# Patient Record
Sex: Male | Born: 1945
Health system: Southern US, Community
[De-identification: ages and names within clinical notes are randomized; demographics above are authoritative.]

## PROBLEM LIST (undated history)

## (undated) DIAGNOSIS — G4730 Sleep apnea, unspecified: Secondary | ICD-10-CM

## (undated) DIAGNOSIS — J449 Chronic obstructive pulmonary disease, unspecified: Secondary | ICD-10-CM

## (undated) DIAGNOSIS — E669 Obesity, unspecified: Secondary | ICD-10-CM

## (undated) DIAGNOSIS — H269 Unspecified cataract: Secondary | ICD-10-CM

## (undated) DIAGNOSIS — R972 Elevated prostate specific antigen [PSA]: Secondary | ICD-10-CM

## (undated) DIAGNOSIS — N529 Male erectile dysfunction, unspecified: Secondary | ICD-10-CM

## (undated) DIAGNOSIS — C801 Malignant (primary) neoplasm, unspecified: Secondary | ICD-10-CM

## (undated) DIAGNOSIS — D573 Sickle-cell trait: Secondary | ICD-10-CM

## (undated) DIAGNOSIS — Z961 Presence of intraocular lens: Secondary | ICD-10-CM

## (undated) DIAGNOSIS — Z972 Presence of dental prosthetic device (complete) (partial): Secondary | ICD-10-CM

## (undated) DIAGNOSIS — D649 Anemia, unspecified: Secondary | ICD-10-CM

## (undated) DIAGNOSIS — J189 Pneumonia, unspecified organism: Secondary | ICD-10-CM

## (undated) DIAGNOSIS — E538 Deficiency of other specified B group vitamins: Secondary | ICD-10-CM

## (undated) DIAGNOSIS — D709 Neutropenia, unspecified: Secondary | ICD-10-CM

## (undated) DIAGNOSIS — M1990 Unspecified osteoarthritis, unspecified site: Secondary | ICD-10-CM

## (undated) DIAGNOSIS — C61 Malignant neoplasm of prostate: Secondary | ICD-10-CM

## (undated) DIAGNOSIS — H401190 Primary open-angle glaucoma, unspecified eye, stage unspecified: Secondary | ICD-10-CM

## (undated) DIAGNOSIS — I10 Essential (primary) hypertension: Secondary | ICD-10-CM

## (undated) HISTORY — PX: COLONOSCOPY: SHX174

## (undated) HISTORY — PX: GLAUCOMA SURGERY: SHX656

## (undated) HISTORY — PX: EYE SURGERY: SHX253

## (undated) HISTORY — PX: OTHER SURGICAL HISTORY: SHX169

## (undated) HISTORY — PX: HERNIA REPAIR: SHX51

## (undated) HISTORY — PX: TRABECULECTOMY: SHX107

---

## 2005-08-11 ENCOUNTER — Ambulatory Visit: Payer: Self-pay | Admitting: Gastroenterology

## 2006-09-16 ENCOUNTER — Ambulatory Visit: Payer: Self-pay | Admitting: Ophthalmology

## 2006-09-16 ENCOUNTER — Other Ambulatory Visit: Payer: Self-pay

## 2006-09-23 ENCOUNTER — Ambulatory Visit: Payer: Self-pay | Admitting: Ophthalmology

## 2010-01-18 ENCOUNTER — Ambulatory Visit: Payer: Self-pay | Admitting: Family Medicine

## 2011-03-14 ENCOUNTER — Ambulatory Visit: Payer: Self-pay | Admitting: Internal Medicine

## 2011-04-03 ENCOUNTER — Ambulatory Visit: Payer: Self-pay | Admitting: Internal Medicine

## 2011-04-24 ENCOUNTER — Ambulatory Visit: Payer: Self-pay | Admitting: Internal Medicine

## 2012-04-13 DIAGNOSIS — Z961 Presence of intraocular lens: Secondary | ICD-10-CM | POA: Insufficient documentation

## 2012-04-15 ENCOUNTER — Ambulatory Visit: Payer: Self-pay | Admitting: Specialist

## 2013-06-23 DIAGNOSIS — C61 Malignant neoplasm of prostate: Secondary | ICD-10-CM | POA: Insufficient documentation

## 2014-01-17 ENCOUNTER — Ambulatory Visit: Payer: Self-pay | Admitting: Nurse Practitioner

## 2014-04-01 ENCOUNTER — Ambulatory Visit: Payer: Self-pay | Admitting: Family Medicine

## 2014-04-01 ENCOUNTER — Observation Stay: Payer: Self-pay | Admitting: Surgery

## 2014-04-01 LAB — CBC WITH DIFFERENTIAL/PLATELET
BASOS ABS: 0 10*3/uL (ref 0.0–0.1)
Basophil %: 0.1 %
EOS ABS: 0 10*3/uL (ref 0.0–0.7)
Eosinophil %: 0.1 %
HCT: 47.3 % (ref 40.0–52.0)
HGB: 15.3 g/dL (ref 13.0–18.0)
LYMPHS ABS: 0.5 10*3/uL — AB (ref 1.0–3.6)
Lymphocyte %: 5.7 %
MCH: 29.5 pg (ref 26.0–34.0)
MCHC: 32.4 g/dL (ref 32.0–36.0)
MCV: 91 fL (ref 80–100)
MONO ABS: 0.5 x10 3/mm (ref 0.2–1.0)
MONOS PCT: 5.4 %
NEUTROS PCT: 88.7 %
Neutrophil #: 7.6 10*3/uL — ABNORMAL HIGH (ref 1.4–6.5)
PLATELETS: 229 10*3/uL (ref 150–440)
RBC: 5.2 10*6/uL (ref 4.40–5.90)
RDW: 13.2 % (ref 11.5–14.5)
WBC: 8.6 10*3/uL (ref 3.8–10.6)

## 2014-04-01 LAB — BASIC METABOLIC PANEL
Anion Gap: 10 (ref 7–16)
BUN: 26 mg/dL — AB (ref 7–18)
CALCIUM: 9.3 mg/dL (ref 8.5–10.1)
Chloride: 108 mmol/L — ABNORMAL HIGH (ref 98–107)
Co2: 25 mmol/L (ref 21–32)
Creatinine: 1.36 mg/dL — ABNORMAL HIGH (ref 0.60–1.30)
EGFR (African American): >60
GFR CALC NON AF AMER: 56 — AB
GLUCOSE: 122 mg/dL — AB (ref 65–99)
OSMOLALITY: 291 (ref 275–301)
Potassium: 3.5 mmol/L (ref 3.5–5.1)
Sodium: 143 mmol/L (ref 136–145)

## 2014-04-01 LAB — PROTIME-INR
INR: 1
Prothrombin Time: 13.2 secs (ref 11.5–14.7)

## 2014-04-01 LAB — AMYLASE: Amylase: 72 U/L (ref 25–115)

## 2014-04-01 LAB — APTT: Activated PTT: 23.7 secs (ref 23.6–35.9)

## 2014-04-01 LAB — MAGNESIUM: MAGNESIUM: 2.2 mg/dL

## 2014-04-02 LAB — BASIC METABOLIC PANEL
Anion Gap: 7 (ref 7–16)
BUN: 25 mg/dL — AB (ref 7–18)
CREATININE: 1.2 mg/dL (ref 0.60–1.30)
Calcium, Total: 8.4 mg/dL — ABNORMAL LOW (ref 8.5–10.1)
Chloride: 111 mmol/L — ABNORMAL HIGH (ref 98–107)
Co2: 25 mmol/L (ref 21–32)
GLUCOSE: 113 mg/dL — AB (ref 65–99)
Osmolality: 290 (ref 275–301)
Potassium: 3.4 mmol/L — ABNORMAL LOW (ref 3.5–5.1)
Sodium: 143 mmol/L (ref 136–145)

## 2014-04-02 LAB — CBC WITH DIFFERENTIAL/PLATELET
BASOS ABS: 0 10*3/uL (ref 0.0–0.1)
Basophil %: 0.3 %
EOS ABS: 0.1 10*3/uL (ref 0.0–0.7)
Eosinophil %: 1.5 %
HCT: 39.5 % — ABNORMAL LOW (ref 40.0–52.0)
HGB: 12.9 g/dL — ABNORMAL LOW (ref 13.0–18.0)
LYMPHS PCT: 23.2 %
Lymphocyte #: 1.4 10*3/uL (ref 1.0–3.6)
MCH: 29.5 pg (ref 26.0–34.0)
MCHC: 32.6 g/dL (ref 32.0–36.0)
MCV: 91 fL (ref 80–100)
MONO ABS: 0.9 x10 3/mm (ref 0.2–1.0)
Monocyte %: 16.1 %
Neutrophil #: 3.4 10*3/uL (ref 1.4–6.5)
Neutrophil %: 58.9 %
Platelet: 204 10*3/uL (ref 150–440)
RBC: 4.36 10*6/uL — AB (ref 4.40–5.90)
RDW: 12.8 % (ref 11.5–14.5)
WBC: 5.8 10*3/uL (ref 3.8–10.6)

## 2014-04-10 ENCOUNTER — Ambulatory Visit: Payer: Self-pay | Admitting: Surgery

## 2014-04-13 ENCOUNTER — Ambulatory Visit: Payer: Self-pay | Admitting: Surgery

## 2014-10-14 NOTE — Op Note (Signed)
PATIENT NAME:  Kirk Wright, Kirk Wright MR#:  [id number] DATE OF BIRTH:  [birth year]  DATE OF PROCEDURE:  04/13/2014  PREOPERATIVE DIAGNOSIS: Right inguinal hernia.   POSTOPERATIVE DIAGNOSIS: Large right inguinal indirect hernia.   PROCEDURE PERFORMED: Robot-assisted laparoscopic right inguinal hernia repair with mesh.   ANESTHESIA: General endotracheal.   ESTIMATED BLOOD LOSS: 15 mL.   COMPLICATIONS: None.   SPECIMENS: None.  INDICATION FOR SURGERY: [name] is a pleasant 69 year old male with a long-term right inguinal hernia who recently was admitted with an episode of incarceration. He presented for semielective inguinal hernia repair.   DETAILS OF PROCEDURE: Informed consent was obtained. [name] was brought to the operating room suite. He was induced. Endotracheal tube was placed, general anesthesia was administered. His abdomen was prepped and draped in standard surgical fashion. A timeout was then performed, correctly identifying the patient name, operative site and procedure to be performed. A supraumbilical incision was made. This was deepened down to the fascia. The fascia was incised. The peritoneum was entered. Two stay sutures were placed in the fasciotomy. A Hassan trocar with a balloon was placed into the supraumbilical incision and the abdomen was insufflated. Two 8 mm robotic trocars were placed at the mid clavicular lines lateral to the umbilicus, and a 5 mm 10 mm trocar was placed in between the lateralmost and midline umbilical trocar. The robot was then docked. A bipolar grasper and a hot scissors were placed through the left and right arms, respectively. The hernia on the right was examined. It appeared to have a large component of an indirect hernia. I then proceeded to peel the peritoneum off the abdominal wall and created a peritoneal flap. I then reduced the sac with some difficulty from the hernia. I isolated Cooper ligament after the sac was reduced and the cord was  protected and identified and cord structures were protected. After that, I placed a Bard 3DMax mesh and attached it to [place] ligament with tacks and to the anterior abdominal wall with tacks. After happy with the repair, I then sewed the peritoneum shut with a quilled suture. I then removed all trocars under direct visualization. I used a figure-of-eight 0 Vicryl to close the supraumbilical fascia as well as a figure-of-eight 0 Vicryl to close the 10 mm trocar sites. I then closed all skin incisions with deep dermal 4-0 Monocryl sutures. The patient was then awoken, extubated and brought to the postanesthesia care unit. There were no immediate complications. Needle, sponge, and instrument counts were correct at the end of the procedure.    ____________________________ [name]. [name], MD cal:ST D: 04/15/2014 12:12:47 ET T: 04/15/2014 23:30:35 ET JOB#: [id number]  cc: Harrell Gave A. Lilou Kneip, MD, <Dictator> Floyde Parkins MD ELECTRONICALLY SIGNED 04/25/2014 7:02

## 2014-10-14 NOTE — H&P (Signed)
   History of Present Illness 85 yom who I saw recently with a RIH, who decided against surgery. Yesterday about 100 PM (30 hours ago) his hernia "came out and wouldn't go back in." This was associated with nausea, vomiting, obstipation, but no fever and no abdominal bloating or distention. He says he didn't sleep well.   Past Med/Surgical Hx:  anemia:   htn:   sickle  cell trait:   insomnia:   degenerative joint disease:   glaucoma:   hypercholesterolemia:   erectile dysfunction:   ALLERGIES:  Other- Explain in Comments Line: Hives  IVP Dye: Hives  HOME MEDICATIONS: Medication Instructions Status  hydrochlorothiazide-lisinopril 25 mg-20 mg oral tablet 1 tab(s) orally once a day Active  aspirin 81 mg oral delayed release tablet 1 tab(s) orally once a day Active   Family and Social History:  Family History Non-Contributory   Social History negative tobacco, negative ETOH, separated 1 year, no smoking or ETOH, lots of second hand smoke prior to separation, lives with his brother, works in returns and lifts a Education officer, [employer]   Review of Systems:  Fever/Chills No   Cough No   Sputum No   Abdominal Pain Yes   Diarrhea No   Constipation No   Nausea/Vomiting Yes   SOB/DOE No   Chest Pain No   Dysuria No   Tolerating PT Yes   Tolerating Diet No  Nauseated  Vomiting   Medications/Allergies Reviewed Medications/Allergies reviewed   Physical Exam:  GEN well developed, well nourished   HEENT pink conjunctivae, PERRL, hearing intact to voice, moist oral mucosa   NECK supple  No masses  trachea midline   RESP normal resp effort  clear BS  no use of accessory muscles   CARD regular rate  no murmur  no JVD   ABD denies tenderness  abdomen soft, flat, nontender, nondistended   GU large but soft (?direct) RIH, incarcerated, but manually reduced   EXTR negative cyanosis/clubbing, negative edema   SKIN normal to palpation, skin turgor good   NEURO  cranial nerves intact, follows commands, motor/sensory function intact   PSYCH lethargic, appropriate, but very drowsy, particularly when compared to my previous visit with him    Assessment/Admission Diagnosis Incarcerated RIH, now reduced, but patient is very drowsy   Plan Admit, check labs today and in AM Laparotomy if he deteriorates, semi-elective RIH repair on Monday if he improves   Electronic Signatures: Consuela Mimes (MD)  (Signed 10-Oct-15 18:19)  Authored: CHIEF COMPLAINT and HISTORY, PAST MEDICAL/SURGIAL HISTORY, ALLERGIES, HOME MEDICATIONS, FAMILY AND SOCIAL HISTORY, REVIEW OF SYSTEMS, PHYSICAL EXAM, ASSESSMENT AND PLAN   Last Updated: 10-Oct-15 18:19 by Consuela Mimes (MD)

## 2015-02-22 ENCOUNTER — Encounter: Payer: Self-pay | Admitting: *Deleted

## 2015-02-23 ENCOUNTER — Ambulatory Visit: Payer: BLUE CROSS/BLUE SHIELD | Admitting: Anesthesiology

## 2015-02-23 ENCOUNTER — Encounter
Admission: RE | Disposition: A | Discharge status: Home / Self Care | Payer: Self-pay | Source: Ambulatory Visit | Attending: Gastroenterology

## 2015-02-23 ENCOUNTER — Ambulatory Visit
Admission: RE | Admit: 2015-02-23 | Discharge: 2015-02-23 | Disposition: A | Discharge status: Home / Self Care | Payer: BLUE CROSS/BLUE SHIELD | Source: Ambulatory Visit | Attending: Gastroenterology | Admitting: Gastroenterology

## 2015-02-23 DIAGNOSIS — Z1211 Encounter for screening for malignant neoplasm of colon: Secondary | ICD-10-CM | POA: Diagnosis not present

## 2015-02-23 DIAGNOSIS — N529 Male erectile dysfunction, unspecified: Secondary | ICD-10-CM | POA: Insufficient documentation

## 2015-02-23 DIAGNOSIS — J449 Chronic obstructive pulmonary disease, unspecified: Secondary | ICD-10-CM | POA: Diagnosis not present

## 2015-02-23 DIAGNOSIS — Z6825 Body mass index (BMI) 25.0-25.9, adult: Secondary | ICD-10-CM | POA: Insufficient documentation

## 2015-02-23 DIAGNOSIS — Z79899 Other long term (current) drug therapy: Secondary | ICD-10-CM | POA: Diagnosis not present

## 2015-02-23 DIAGNOSIS — I10 Essential (primary) hypertension: Secondary | ICD-10-CM | POA: Diagnosis not present

## 2015-02-23 DIAGNOSIS — K644 Residual hemorrhoidal skin tags: Secondary | ICD-10-CM | POA: Diagnosis not present

## 2015-02-23 DIAGNOSIS — Z91041 Radiographic dye allergy status: Secondary | ICD-10-CM | POA: Diagnosis not present

## 2015-02-23 DIAGNOSIS — D123 Benign neoplasm of transverse colon: Secondary | ICD-10-CM | POA: Insufficient documentation

## 2015-02-23 DIAGNOSIS — R972 Elevated prostate specific antigen [PSA]: Secondary | ICD-10-CM | POA: Insufficient documentation

## 2015-02-23 DIAGNOSIS — K635 Polyp of colon: Secondary | ICD-10-CM | POA: Diagnosis not present

## 2015-02-23 DIAGNOSIS — Z86010 Personal history of colonic polyps: Secondary | ICD-10-CM | POA: Insufficient documentation

## 2015-02-23 DIAGNOSIS — E669 Obesity, unspecified: Secondary | ICD-10-CM | POA: Diagnosis not present

## 2015-02-23 DIAGNOSIS — D573 Sickle-cell trait: Secondary | ICD-10-CM | POA: Insufficient documentation

## 2015-02-23 DIAGNOSIS — D128 Benign neoplasm of rectum: Secondary | ICD-10-CM | POA: Insufficient documentation

## 2015-02-23 DIAGNOSIS — K5730 Diverticulosis of large intestine without perforation or abscess without bleeding: Secondary | ICD-10-CM | POA: Insufficient documentation

## 2015-02-23 DIAGNOSIS — H409 Unspecified glaucoma: Secondary | ICD-10-CM | POA: Insufficient documentation

## 2015-02-23 DIAGNOSIS — G4730 Sleep apnea, unspecified: Secondary | ICD-10-CM | POA: Insufficient documentation

## 2015-02-23 DIAGNOSIS — Z87891 Personal history of nicotine dependence: Secondary | ICD-10-CM | POA: Insufficient documentation

## 2015-02-23 DIAGNOSIS — Z7982 Long term (current) use of aspirin: Secondary | ICD-10-CM | POA: Insufficient documentation

## 2015-02-23 HISTORY — DX: Neutropenia, unspecified: D70.9

## 2015-02-23 HISTORY — DX: Presence of intraocular lens: Z96.1

## 2015-02-23 HISTORY — DX: Pneumonia, unspecified organism: J18.9

## 2015-02-23 HISTORY — DX: Sickle-cell trait: D57.3

## 2015-02-23 HISTORY — DX: Male erectile dysfunction, unspecified: N52.9

## 2015-02-23 HISTORY — DX: Essential (primary) hypertension: I10

## 2015-02-23 HISTORY — DX: Unspecified osteoarthritis, unspecified site: M19.90

## 2015-02-23 HISTORY — DX: Primary open-angle glaucoma, unspecified eye, stage unspecified: H40.1190

## 2015-02-23 HISTORY — PX: COLONOSCOPY WITH PROPOFOL: SHX5780

## 2015-02-23 HISTORY — DX: Obesity, unspecified: E66.9

## 2015-02-23 HISTORY — DX: Deficiency of other specified B group vitamins: E53.8

## 2015-02-23 HISTORY — DX: Elevated prostate specific antigen (PSA): R97.20

## 2015-02-23 HISTORY — DX: Chronic obstructive pulmonary disease, unspecified: J44.9

## 2015-02-23 HISTORY — DX: Unspecified cataract: H26.9

## 2015-02-23 HISTORY — DX: Sleep apnea, unspecified: G47.30

## 2015-02-23 SURGERY — COLONOSCOPY WITH PROPOFOL
Anesthesia: General

## 2015-02-23 MED ORDER — EPHEDRINE SULFATE 50 MG/ML IJ SOLN
INTRAMUSCULAR | Status: DC | PRN
  Administered 2015-02-23: 10 mg via INTRAVENOUS
  Administered 2015-02-23: 5 mg via INTRAVENOUS

## 2015-02-23 MED ORDER — PROPOFOL INFUSION 10 MG/ML OPTIME
INTRAVENOUS | Status: DC | PRN
Start: 2015-02-23 — End: 2015-02-23
  Administered 2015-02-23: 120 ug/kg/min via INTRAVENOUS

## 2015-02-23 MED ORDER — SODIUM CHLORIDE 0.9 % IV SOLN: INTRAVENOUS | Status: DC

## 2015-02-23 MED ORDER — GLYCOPYRROLATE 0.2 MG/ML IJ SOLN
INTRAMUSCULAR | Status: DC | PRN
  Administered 2015-02-23: 0.2 mg via INTRAVENOUS

## 2015-02-23 MED ORDER — PROPOFOL 10 MG/ML IV BOLUS
INTRAVENOUS | Status: DC | PRN
  Administered 2015-02-23: 50 mg via INTRAVENOUS
  Administered 2015-02-23: 20 mg via INTRAVENOUS

## 2015-02-23 MED ORDER — LIDOCAINE HCL (CARDIAC) 20 MG/ML IV SOLN
INTRAVENOUS | Status: DC | PRN
  Administered 2015-02-23: 50 mg via INTRAVENOUS

## 2015-02-23 MED ORDER — SODIUM CHLORIDE 0.9 % IV SOLN
INTRAVENOUS | Status: DC
  Administered 2015-02-23: 1000 mL via INTRAVENOUS

## 2015-02-23 MED ORDER — MIDAZOLAM HCL 5 MG/5ML IJ SOLN
INTRAMUSCULAR | Status: DC | PRN
  Administered 2015-02-23: 1 mg via INTRAVENOUS

## 2015-02-23 NOTE — H&P (Signed)
Outpatient short stay form Pre-procedure 02/23/2015 8:19 AM [name] MD  Primary Physician: Dr Sherrin Daisy  Reason for visit:  Colonoscopy  History of present illness:  Patient is a 69 year old male with a personal history of tubulovillous polyp on a colonoscopy in 2007. He is presenting for repeat colonoscopy today. He tolerated his prep well. He does take an 81 mg aspirin but has not for several days. He has not had any symptoms such as rectal bleeding or abdominal pain.    Current facility-administered medications:  .  0.9 %  sodium chloride infusion, , Intravenous, Continuous, [name], MD, Last Rate: 20 mL/hr at 02/23/15 0814, 1,000 mL at 02/23/15 0814 .  0.9 %  sodium chloride infusion, , Intravenous, Continuous, [name], MD  Prescriptions prior to admission  Medication Sig Dispense Refill Last Dose  . acetaZOLAMIDE (DIAMOX) 250 MG tablet Take 250 mg by mouth 3 (three) times daily.     Marland Kitchen aspirin 81 MG tablet Take 81 mg by mouth daily.     . bimatoprost (LUMIGAN) 0.03 % ophthalmic solution 1 drop at bedtime.     . brimonidine-timolol (COMBIGAN) 0.2-0.5 % ophthalmic solution Place 1 drop into both eyes every 12 (twelve) hours.     . brinzolamide (AZOPT) 1 % ophthalmic suspension 1 drop 3 (three) times daily.     . ferrous sulfate 325 (65 FE) MG tablet Take 325 mg by mouth daily with breakfast.     . ibuprofen (ADVIL,MOTRIN) 200 MG tablet Take 200 mg by mouth every 6 (six) hours as needed.     Marland Kitchen lisinopril-hydrochlorothiazide (PRINZIDE,ZESTORETIC) 10-12.5 MG per tablet Take 1 tablet by mouth daily.     . tadalafil (CIALIS) 5 MG tablet Take 5 mg by mouth daily as needed for erectile dysfunction.        Allergies  Allergen Reactions  . Contrast Media [Iodinated Diagnostic Agents] Rash     Past Medical History  Diagnosis Date  . COPD (chronic obstructive pulmonary disease)   . Vitamin B 12 deficiency   . Cataract   . Neutropenia   . Elevated PSA    . Sleep apnea   . Hypertension   . POAG (primary open-angle glaucoma)   . Pneumonia   . Pseudophakia of both eyes   . Erectile dysfunction   . DJD (degenerative joint disease)   . Obesity   . Sickle cell trait   . Obesity     Review of systems:      Physical Exam    Heart and lungs: Regular rate and rhythm without rub or gallop, lungs are bilaterally clear    HEENT: Normocephalic atraumatic eyes are anicteric    Other:     Pertinant exam for procedure: Soft nontender nondistended bowel sounds positive normoactive    Planned proceedures: Colonoscopy and indicated procedures I have discussed the risks benefits and complications of procedures to include not limited to bleeding, infection, perforation and the risk of sedation and the patient wishes to proceed.    [name], MD Gastroenterology 02/23/2015  8:19 AM

## 2015-02-23 NOTE — Op Note (Signed)
[facility] Gastroenterology Patient Name: Kirk Wright Procedure Date: 02/23/2015 8:22 AM MRN: [id number] Account #: [id number] Date of Birth: [birth year] Admit Type: Outpatient Age: 69 Room: [facility] ENDO ROOM 3 Gender: Male Note Status: Finalized Procedure:         Colonoscopy Indications:       Personal history of colonic polyps Providers:         [name], MD Referring MD:      Shirline Frees (Referring MD) Medicines:         Monitored Anesthesia Care Complications:     No immediate complications. Procedure:         Pre-Anesthesia Assessment:                    - ASA Grade Assessment: III - A patient with severe                     systemic disease.                    After obtaining informed consent, the colonoscope was                     passed under direct vision. Throughout the procedure, the                     patient's blood pressure, pulse, and oxygen saturations                     were monitored continuously. The Colonoscope was                     introduced through the anus and advanced to the the cecum,                     identified by appendiceal orifice and ileocecal valve. The                     colonoscopy was performed without difficulty. The patient                     tolerated the procedure well. The quality of the bowel                     preparation was good. Findings:      Many small and large-mouthed diverticula were found in the proximal       transverse colon and in the ascending colon.      Multiple small-mouthed diverticula were found in the sigmoid colon and       in the descending colon.      A 2 mm polyp was found at the splenic flexure. The polyp was sessile.       The polyp was removed with a cold biopsy forceps. Resection and       retrieval were complete.      A 2 mm polyp was found in the descending colon. The polyp was sessile.       The polyp was removed with a cold biopsy forceps. Resection and   retrieval were complete.      Four sessile polyps were found in the rectum. The polyps were less than       1 mm in size. These polyps were removed with a cold biopsy forceps.       Resection and retrieval were complete.  The retroflexed view of the distal rectum and anal verge was normal and       showed no anal or rectal abnormalities.      Non-bleeding external hemorrhoids were found. The hemorrhoids were small. Impression:        - Diverticulosis in the proximal transverse colon and in                     the ascending colon.                    - Diverticulosis in the sigmoid colon and in the                     descending colon.                    - One 2 mm polyp at the splenic flexure. Resected and                     retrieved.                    - One 2 mm polyp in the descending colon. Resected and                     retrieved.                    - Four less than 1 mm polyps in the rectum. Resected and                     retrieved.                    - The distal rectum and anal verge are normal on                     retroflexion view.                    - Non-bleeding external hemorrhoids. Recommendation:    - Repeat colonoscopy in 5 years for adenoma surveillance. Procedure Code(s): --- Professional ---                    [phone], Colonoscopy, flexible; with biopsy, single or                     multiple Diagnosis Code(s): --- Professional ---                    211.3, Benign neoplasm of colon                    569.0, Anal and rectal polyp                    455.3, External hemorrhoids without mention of complication                    V12.72, Personal history of colonic polyps                    562.10, Diverticulosis of colon (without mention of                     hemorrhage) CPT copyright 2014 American Medical Association. All rights reserved. The codes documented in this report are preliminary and upon coder review may  be revised to meet  current compliance  requirements. [name], MD 02/23/2015 8:46:33 AM This report has been signed electronically. Number of Addenda: 0 Note Initiated On: 02/23/2015 8:22 AM Scope Withdrawal Time: 0 hours 8 minutes 40 seconds  Total Procedure Duration: 0 hours 13 minutes 10 seconds       [facility]

## 2015-02-23 NOTE — Anesthesia Preprocedure Evaluation (Signed)
Anesthesia Evaluation  Patient identified by MRN, date of birth, ID band Patient awake    Reviewed: Allergy & Precautions, NPO status , Patient's Chart, lab work & pertinent test results  Airway Mallampati: II       Dental  (+) Partial Lower, Poor Dentition, Partial Upper, Chipped   Pulmonary sleep apnea , pneumonia -, resolved, COPD COPD inhaler, former smoker,  breath sounds clear to auscultation  Pulmonary exam normal       Cardiovascular hypertension, Normal cardiovascular exam    Neuro/Psych negative neurological ROS  negative psych ROS   GI/Hepatic negative GI ROS, Neg liver ROS,   Endo/Other  negative endocrine ROS  Renal/GU   negative genitourinary   Musculoskeletal  (+) Arthritis -, Osteoarthritis,    Abdominal Normal abdominal exam  (+)   Peds negative pediatric ROS (+)  Hematology negative hematology ROS (+)   Anesthesia Other Findings glaucoma  Reproductive/Obstetrics                             Anesthesia Physical Anesthesia Plan  ASA: III  Anesthesia Plan: General   Post-op Pain Management:    Induction: Intravenous  Airway Management Planned: Nasal Cannula  Additional Equipment:   Intra-op Plan:   Post-operative Plan:   Informed Consent: I have reviewed the patients History and Physical, chart, labs and discussed the procedure including the risks, benefits and alternatives for the proposed anesthesia with the patient or authorized representative who has indicated his/her understanding and acceptance.   Dental advisory given  Plan Discussed with: CRNA and Surgeon  Anesthesia Plan Comments:         Anesthesia Quick Evaluation

## 2015-02-23 NOTE — Transfer of Care (Signed)
Immediate Anesthesia Transfer of Care Note  Patient: Kirk Wright  Procedure(s) Performed: Procedure(s): COLONOSCOPY WITH PROPOFOL (N/A)  Patient Location: PACU  Anesthesia Type:General  Level of Consciousness: awake  Airway & Oxygen Therapy: Patient Spontanous Breathing and Patient connected to nasal cannula oxygen  Post-op Assessment: Report given to RN  Post vital signs: Reviewed  Last Vitals:  Filed Vitals:   02/23/15 0850  BP: 89/57  Pulse: 60  Temp: 36.2 C  Resp: 10    Complications: No apparent anesthesia complications

## 2015-02-27 LAB — SURGICAL PATHOLOGY

## 2015-02-27 NOTE — Anesthesia Postprocedure Evaluation (Signed)
  Anesthesia Post-op Note  Patient: Kirk Wright  Procedure(s) Performed: Procedure(s): COLONOSCOPY WITH PROPOFOL (N/A)  Anesthesia type:General  Patient location: PACU  Post pain: Pain level controlled  Post assessment: Post-op Vital signs reviewed, Patient's Cardiovascular Status Stable, Respiratory Function Stable, Patent Airway and No signs of Nausea or vomiting  Post vital signs: Reviewed and stable  Last Vitals:  Filed Vitals:   02/23/15 0920  BP: 109/63  Pulse: 45  Temp:   Resp: 13    Level of consciousness: awake, alert  and patient cooperative  Complications: No apparent anesthesia complications

## 2016-09-23 NOTE — Progress Notes (Signed)
09/26/2016 1:49 PM   Kirk Wright 10/25/1945 [id number]  Referring provider: Sherrin Daisy, MD [place], [place] [address]  Chief Complaint  Patient presents with  . New Patient (Initial Visit)    HPI: Patient is a 71 year old African-American male with prostate cancer an AS, elevated PSA and erectile dysfunction who is referred by Dr. Kandice Robinsons.  Prostate cancer Patient underwent a prostate biopsy on 11/14/2012 with Dr. [name] for a PSA of 6.86.  58 gram prostate.  Results were Gleason 3 + 3 cancer in 2/12 cores.  2% core of lateral mid and 8% of lateral apex.  He was then referred to Dr. [name] on 01/25/2013 for possible robotic prostatectomy.  Patient chose to manage his prostate cancer with active surveillance.  He was to RTC in 6 months, but he was lost to follow up.  His last PSA was 6.18 on 10/13/2014.  PSA is drawn today.    BPH WITH LUTS His IPSS score today is 6, which is mild lower urinary tract symptomatology. He is mostly satisfied with his quality life due to his urinary symptoms.  His major complaints today are a weak stream and incomplete emptying.  He has had these symptoms for several years.  He denies any dysuria, hematuria or suprapubic pain.   He currently taking Cialis 5 mg daily.    He also denies any recent fevers, chills, nausea or vomiting.     IPSS    Row Name 09/26/16 1400         International Prostate Symptom Score   How often have you had the sensation of not emptying your bladder? Less than half the time     How often have you had to urinate less than every two hours? Less than 1 in 5 times     How often have you found you stopped and started again several times when you urinated? Not at All     How often have you found it difficult to postpone urination? Not at All     How often have you had a weak urinary stream? About half the time     How often have you had to strain to start urination? Not at All     How many times did  you typically get up at night to urinate? None     Total IPSS Score 6       Quality of Life due to urinary symptoms   If you were to spend the rest of your life with your urinary condition just the way it is now how would you feel about that? Mostly Satisfied        Score:  1-7 Mild 8-19 Moderate 20-35 Severe   Erectile dysfunction His SHIM score is 21, which is mild ED.   He has been having difficulty with erections for twenty years.  His major complaint is maintaining an erection.   His libido is preserved.   His risk factors for ED are age, BPH, prostate cancer, HTN and sleep apnea.  He denies any painful erections or curvatures with his erections.   He is still having spontaneous erections.  He has tried Cialis and Viagra in the past with minimal results.       SHIM    Row Name 09/26/16 1436         SHIM: Over the last 6 months:   How do you rate your confidence that you could get and keep an erection? Very  Low     When you had erections with sexual stimulation, how often were your erections hard enough for penetration (entering your partner)? Almost Always or Always     During sexual intercourse, how often were you able to maintain your erection after you had penetrated (entered) your partner? Almost Always or Always     During sexual intercourse, how difficult was it to maintain your erection to completion of intercourse? Not Difficult     When you attempted sexual intercourse, how often was it satisfactory for you? Almost Always or Always       SHIM Total Score   SHIM 21        Score: 1-7 Severe ED 8-11 Moderate ED 12-16 Mild-Moderate ED 17-21 Mild ED 22-25 No ED   PMH: Past Medical History:  Diagnosis Date  . Cataract   . COPD (chronic obstructive pulmonary disease) ([place])   . DJD (degenerative joint disease)   . Elevated PSA   . Erectile dysfunction   . Hypertension   . Neutropenia ([place])   . Obesity   . Obesity   . Pneumonia   . POAG (primary open-angle  glaucoma)   . Pseudophakia of both eyes   . Sickle cell trait ([place])   . Sleep apnea   . Vitamin B 12 deficiency     Surgical History: Past Surgical History:  Procedure Laterality Date  . COLONOSCOPY WITH PROPOFOL N/A 02/23/2015   Procedure: COLONOSCOPY WITH PROPOFOL;  Surgeon: [name], MD;  Location: [facility] ENDOSCOPY;  Service: Endoscopy;  Laterality: N/A;  . EYE SURGERY    . GLAUCOMA SURGERY    . HERNIA REPAIR    . PROSTRATE BX      Home Medications:  Allergies as of 09/26/2016      Reactions   Contrast Media [iodinated Diagnostic Agents] Rash      Medication List       Accurate as of 09/26/16 11:59 PM. Always use your most recent med list.          acetaZOLAMIDE 250 MG tablet Commonly known as:  DIAMOX Take 250 mg by mouth 3 (three) times daily.   aspirin 81 MG tablet Take 81 mg by mouth daily.   bimatoprost 0.03 % ophthalmic solution Commonly known as:  LUMIGAN 1 drop at bedtime.   brimonidine-timolol 0.2-0.5 % ophthalmic solution Commonly known as:  COMBIGAN Place 1 drop into both eyes every 12 (twelve) hours.   brinzolamide 1 % ophthalmic suspension Commonly known as:  AZOPT 1 drop 3 (three) times daily.   ferrous sulfate 325 (65 FE) MG tablet Take 325 mg by mouth daily with breakfast.   ibuprofen 200 MG tablet Commonly known as:  ADVIL,MOTRIN Take 200 mg by mouth every 6 (six) hours as needed.   lisinopril-hydrochlorothiazide 10-12.5 MG tablet Commonly known as:  PRINZIDE,ZESTORETIC Take 1 tablet by mouth daily.   tadalafil 5 MG tablet Commonly known as:  CIALIS Take 5 mg by mouth daily as needed for erectile dysfunction.       Allergies:  Allergies  Allergen Reactions  . Contrast Media [Iodinated Diagnostic Agents] Rash    Family History: Family History  Problem Relation Age of Onset  . Kidney cancer Neg Hx   . Kidney disease Neg Hx   . Prostate cancer Neg Hx     Social History:  reports that he has quit smoking. He has quit  using smokeless tobacco. He reports that he does not drink alcohol or use drugs.  ROS:  UROLOGY Frequent Urination?: No Hard to postpone urination?: No Burning/pain with urination?: No Get up at night to urinate?: No Leakage of urine?: No Urine stream starts and stops?: No Trouble starting stream?: No Do you have to strain to urinate?: No Blood in urine?: No Urinary tract infection?: No Sexually transmitted disease?: No Injury to kidneys or bladder?: No Painful intercourse?: No Weak stream?: Yes Erection problems?: Yes Penile pain?: No  Gastrointestinal Nausea?: No Vomiting?: No Indigestion/heartburn?: No Diarrhea?: No Constipation?: No  Constitutional Fever: No Night sweats?: No Weight loss?: No Fatigue?: No  Skin Skin rash/lesions?: No Itching?: Yes  Eyes Blurred vision?: No Double vision?: No  Ears/Nose/Throat Sore throat?: No Sinus problems?: No  Hematologic/Lymphatic Swollen glands?: No Easy bruising?: No  Cardiovascular Leg swelling?: No Chest pain?: No  Respiratory Cough?: No Shortness of breath?: No  Endocrine Excessive thirst?: No  Musculoskeletal Back pain?: No Joint pain?: No  Neurological Headaches?: No Dizziness?: No  Psychologic Depression?: No Anxiety?: No  Physical Exam: BP 126/67   Pulse 90   Ht 6' (1.829 m)   Wt 212 lb (96.2 kg)   BMI 28.75 kg/m   Constitutional: Well nourished. Alert and oriented, No acute distress. HEENT: [place] AT, moist mucus membranes. Trachea midline, no masses. Cardiovascular: No clubbing, cyanosis, or edema. Respiratory: Normal respiratory effort, no increased work of breathing. GI: Abdomen is soft, non tender, non distended, no abdominal masses. Liver and spleen not palpable.  No hernias appreciated.  Stool sample for occult testing is not indicated.   GU: No CVA tenderness.  No bladder fullness or masses.  Patient with uncircumcised phallus.  Foreskin easily retracted Urethral meatus is  patent.  No penile discharge. No penile lesions or rashes. Scrotum without lesions, cysts, rashes and/or edema.  Testicles are located scrotally bilaterally. No masses are appreciated in the testicles. Left and right epididymis are normal. Rectal: Patient with  normal sphincter tone. Anus and perineum without scarring or rashes. No rectal masses are appreciated. Prostate is approximately 55 grams, no nodules are appreciated. Seminal vesicles are normal. Skin: No rashes, bruises or suspicious lesions. Lymph: No cervical or inguinal adenopathy. Neurologic: Grossly intact, no focal deficits, moving all 4 extremities. Psychiatric: Normal mood and affect.  Laboratory Data: Lab Results  Component Value Date   WBC 5.8 04/02/2014   HGB 12.9 (L) 04/02/2014   HCT 39.5 (L) 04/02/2014   MCV 91 04/02/2014   PLT 204 04/02/2014    Lab Results  Component Value Date   CREATININE 1.20 04/02/2014    PSA History  6.18 ng/mL on 10/13/2014  Assessment & Plan:    1. Prostate cancer  - Gleason 3 + 3 in 2/12 cores with PSA of 6.86,  on active surveillance - has not had a repeated biopsy  - PSA is drawn today  - Patient will be schedule for a TRUSPBx of prostate.  The procedure is explained and the risks involved, such as blood in urine, blood in stool, blood in semen, infection, urinary retention, and on rare occasions sepsis and death.  Patient understands the risks as explained to him and he wishes to proceed.  Patient is on ASA and is advised to discontinue the medication ten days prior to the biopsy.   2. Elevated PSA  - PSA of 6.18 in 2016 - has not seen an urologist recently  - PSA drawn today   3. Erectile dysfunction  - SHIM score is 21  - I explained to the patient that in order to  achieve an erection it takes good functioning of the nervous system (parasympathetic, sympathetic, sensory and motor), good blood flow into the erectile tissue of the penis and a desire to have sex  - I explained  that conditions like diabetes, hypertension, coronary artery disease, peripheral vascular disease, smoking, alcohol consumption, age, sleep apnea and BPH can diminish the ability to have an erection  - We discussed trying a different PDE5 inhibitor, intra-urethral suppositories, intracavernous vasoactive drug injection therapy, vacuum constriction device and penile prosthesis implantation  - He would like to try Trimix at this time  - RTC for Trimix test dose  4. BPH with LUTS  - IPSS score is 6/2  - Continue conservative management, avoiding bladder irritants and timed voiding's  - most bothersome symptoms is/are weak stream  - Continue Cialis 5 mg daily   - Biopsy of prostate pending   Return for prostate biopsy; schedule Trimix injection in the am.  These notes generated with voice recognition software. I apologize for typographical errors.  Zara Council, [facility] [address], [place], [place] [address] [phone]

## 2016-09-24 ENCOUNTER — Other Ambulatory Visit: Payer: Self-pay | Admitting: *Deleted

## 2016-09-24 DIAGNOSIS — Z8546 Personal history of malignant neoplasm of prostate: Secondary | ICD-10-CM

## 2016-09-26 ENCOUNTER — Other Ambulatory Visit
Admission: RE | Admit: 2016-09-26 | Discharge: 2016-09-26 | Disposition: A | Discharge status: Home / Self Care | Payer: BLUE CROSS/BLUE SHIELD | Source: Ambulatory Visit | Attending: Urology | Admitting: Urology

## 2016-09-26 ENCOUNTER — Ambulatory Visit (INDEPENDENT_AMBULATORY_CARE_PROVIDER_SITE_OTHER): Payer: BLUE CROSS/BLUE SHIELD | Admitting: Urology

## 2016-09-26 ENCOUNTER — Encounter: Payer: Self-pay | Admitting: Urology

## 2016-09-26 ENCOUNTER — Telehealth: Payer: Self-pay | Admitting: Urology

## 2016-09-26 VITALS — BP 126/67 | HR 90 | Ht 72.0 in | Wt 212.0 lb

## 2016-09-26 DIAGNOSIS — C61 Malignant neoplasm of prostate: Secondary | ICD-10-CM

## 2016-09-26 DIAGNOSIS — N401 Enlarged prostate with lower urinary tract symptoms: Secondary | ICD-10-CM | POA: Diagnosis not present

## 2016-09-26 DIAGNOSIS — R972 Elevated prostate specific antigen [PSA]: Secondary | ICD-10-CM | POA: Diagnosis not present

## 2016-09-26 DIAGNOSIS — N138 Other obstructive and reflux uropathy: Secondary | ICD-10-CM | POA: Diagnosis not present

## 2016-09-26 DIAGNOSIS — N529 Male erectile dysfunction, unspecified: Secondary | ICD-10-CM | POA: Diagnosis not present

## 2016-09-26 DIAGNOSIS — Z8546 Personal history of malignant neoplasm of prostate: Secondary | ICD-10-CM | POA: Insufficient documentation

## 2016-09-26 LAB — PSA: PSA: 8.81 ng/mL — ABNORMAL HIGH (ref 0.00–4.00)

## 2016-09-26 NOTE — Telephone Encounter (Signed)
Would you call in a script for Trimix injection for this patient?

## 2016-09-29 ENCOUNTER — Telehealth: Payer: Self-pay

## 2016-09-29 DIAGNOSIS — R972 Elevated prostate specific antigen [PSA]: Secondary | ICD-10-CM

## 2016-09-29 NOTE — Telephone Encounter (Signed)
Spoke with Kirk Wright at [facility] who is going to handle getting labs added.

## 2016-09-29 NOTE — Telephone Encounter (Signed)
Medication called into [facility]. 

## 2016-09-29 NOTE — Telephone Encounter (Signed)
-----   Message from Nori Riis, PA-C sent at 09/29/2016  8:20 AM EDT ----- Please add a free and total PSA to his blood work.

## 2016-09-30 LAB — PSA, TOTAL AND FREE
PSA FREE: 2.28 ng/mL
PSA, Free Pct: 30 %
Prostate Specific Ag, Serum: 7.6 ng/mL — ABNORMAL HIGH (ref 0.0–4.0)

## 2016-10-03 ENCOUNTER — Telehealth: Payer: Self-pay

## 2016-10-03 NOTE — Telephone Encounter (Signed)
He has had a biopsy in the past, but he will need the instructions.  He does take a daily ASA.

## 2016-10-03 NOTE — Telephone Encounter (Signed)
Does he need instructions or just an appt?

## 2016-10-03 NOTE — Telephone Encounter (Signed)
-----   Message from Nori Riis, PA-C sent at 10/03/2016 11:57 AM EDT ----- Spoke with patient regarding his PSA and diagnosis of prostate cancer on AS.  He has not had a repeated biopsy in a few years.  He is agreeable to undergoing another biopsy at this time.

## 2016-10-06 NOTE — Telephone Encounter (Signed)
Spoke with pt in reference to physician who rx ASA daily. Pt stated that was Dr. Harle Stanford who has since retired. Pt then stated he seen [facility] in [place]. Made pt aware will need to get clearance to come off of ASA and then we can proceed forward. Pt voiced understanding of whole conversation.  Clearance was faxed to pt PCP office.

## 2016-10-11 NOTE — Progress Notes (Signed)
10/13/2016 10:12 AM   Kirk Wright 1945/09/23 [id number]  Referring provider: Sherrin Daisy, MD [place], [place] [address]  Chief Complaint  Patient presents with  . Erectile Dysfunction    Trimix injection    HPI: 31 AAM who presents today for a test dose of Trimix injection.  Erectile dysfunction His SHIM score is 21, which is mild ED.   He has been having difficulty with erections for twenty years.  His major complaint is maintaining an erection.   His libido is preserved.   His risk factors for ED are age, BPH, prostate cancer, HTN and sleep apnea.  He denies any painful erections or curvatures with his erections.   He is still having spontaneous erections.  He has tried Cialis and Viagra in the past with minimal results.                   SHIM    Row Name 09/26/16 1436             SHIM: Over the last 6 months:   How do you rate your confidence that you could get and keep an erection? Very Low     When you had erections with sexual stimulation, how often were your erections hard enough for penetration (entering your partner)? Almost Always or Always     During sexual intercourse, how often were you able to maintain your erection after you had penetrated (entered) your partner? Almost Always or Always     During sexual intercourse, how difficult was it to maintain your erection to completion of intercourse? Not Difficult     When you attempted sexual intercourse, how often was it satisfactory for you? Almost Always or Always           SHIM Total Score   SHIM 21        Score: 1-7 Severe ED 8-11 Moderate ED 12-16 Mild-Moderate ED 17-21 Mild ED 22-25 No ED  He presents today for try mix injection.  I explained the injection procedure and patient's questions are answered and he is ready to proceed.  Prostate cancer Patient underwent a prostate biopsy on 11/14/2012 with Dr. [name] for a PSA of 6.86.  58 gram  prostate.  Results were Gleason 3 + 3 cancer in 2/12 cores.  2% core of lateral mid and 8% of lateral apex.  He was then referred to Dr. [name] on 01/25/2013 for possible robotic prostatectomy.  Patient chose to manage his prostate cancer with active surveillance.  He was to RTC in 6 months, but he was lost to follow up.  PSA 8.81 ng/mL in 09/2016.     PMH: Past Medical History:  Diagnosis Date  . Cataract   . COPD (chronic obstructive pulmonary disease) ([place])   . DJD (degenerative joint disease)   . Elevated PSA   . Erectile dysfunction   . Hypertension   . Neutropenia ([place])   . Obesity   . Obesity   . Pneumonia   . POAG (primary open-angle glaucoma)   . Pseudophakia of both eyes   . Sickle cell trait ([place])   . Sleep apnea   . Vitamin B 12 deficiency     Surgical History: Past Surgical History:  Procedure Laterality Date  . COLONOSCOPY WITH PROPOFOL N/A 02/23/2015   Procedure: COLONOSCOPY WITH PROPOFOL;  Surgeon: [name], MD;  Location: [facility] ENDOSCOPY;  Service: Endoscopy;  Laterality: N/A;  . EYE SURGERY    .  GLAUCOMA SURGERY    . HERNIA REPAIR    . PROSTRATE BX      Home Medications:  Allergies as of 10/13/2016      Reactions   Contrast Media [iodinated Diagnostic Agents] Rash      Medication List       Accurate as of 10/13/16 10:12 AM. Always use your most recent med list.          acetaZOLAMIDE 250 MG tablet Commonly known as:  DIAMOX Take 250 mg by mouth 3 (three) times daily.   aspirin 81 MG tablet Take 81 mg by mouth daily.   bimatoprost 0.03 % ophthalmic solution Commonly known as:  LUMIGAN 1 drop at bedtime.   brimonidine-timolol 0.2-0.5 % ophthalmic solution Commonly known as:  COMBIGAN Place 1 drop into both eyes every 12 (twelve) hours.   brinzolamide 1 % ophthalmic suspension Commonly known as:  AZOPT 1 drop 3 (three) times daily.   ferrous sulfate 325 (65 FE) MG tablet Take 325 mg by mouth daily with breakfast.   ibuprofen  200 MG tablet Commonly known as:  ADVIL,MOTRIN Take 200 mg by mouth every 6 (six) hours as needed.   lisinopril-hydrochlorothiazide 10-12.5 MG tablet Commonly known as:  PRINZIDE,ZESTORETIC Take 1 tablet by mouth daily.   PROAIR HFA 108 (90 Base) MCG/ACT inhaler Generic drug:  albuterol TAKE 2 PUFFS INTO THE LUNGS EVERY 6 HOURS AS NEEDED FOR WHEEZING OR SHORTNESS OF BREATH   SYMBICORT 160-4.5 MCG/ACT inhaler Generic drug:  budesonide-formoterol Inhale into the lungs.   tadalafil 5 MG tablet Commonly known as:  CIALIS Take 5 mg by mouth daily as needed for erectile dysfunction.       Allergies:  Allergies  Allergen Reactions  . Contrast Media [Iodinated Diagnostic Agents] Rash    Family History: Family History  Problem Relation Age of Onset  . Kidney cancer Neg Hx   . Kidney disease Neg Hx   . Prostate cancer Neg Hx   . Bladder Cancer Neg Hx     Social History:  reports that he has quit smoking. He has quit using smokeless tobacco. He reports that he does not drink alcohol or use drugs.  ROS: UROLOGY Frequent Urination?: No Hard to postpone urination?: No Burning/pain with urination?: No Get up at night to urinate?: No Leakage of urine?: No Urine stream starts and stops?: No Trouble starting stream?: No Do you have to strain to urinate?: No Blood in urine?: No Urinary tract infection?: No Sexually transmitted disease?: No Injury to kidneys or bladder?: No Painful intercourse?: No Weak stream?: No Erection problems?: Yes Penile pain?: No  Gastrointestinal Nausea?: No Vomiting?: No Indigestion/heartburn?: No Diarrhea?: No Constipation?: No  Constitutional Fever: No Night sweats?: No Weight loss?: No Fatigue?: No  Skin Skin rash/lesions?: No Itching?: No  Eyes Blurred vision?: No Double vision?: No  Ears/Nose/Throat Sore throat?: No Sinus problems?: No  Hematologic/Lymphatic Swollen glands?: No Easy bruising?: No  Cardiovascular Leg  swelling?: No Chest pain?: No  Respiratory Cough?: No Shortness of breath?: No  Endocrine Excessive thirst?: No  Musculoskeletal Back pain?: No Joint pain?: No  Neurological Headaches?: No Dizziness?: No  Psychologic Depression?: No Anxiety?: No  Physical Exam: BP 120/69   Pulse (!) 52   Ht 6' (1.829 m)   Wt 211 lb 8 oz (95.9 kg)   BMI 28.68 kg/m   Constitutional: Well nourished. Alert and oriented, No acute distress. HEENT: [place] AT, moist mucus membranes. Trachea midline, no masses. Cardiovascular: No clubbing, cyanosis,  or edema. Respiratory: Normal respiratory effort, no increased work of breathing. GI: Abdomen is soft, non tender, non distended, no abdominal masses. Liver and spleen not palpable.  No hernias appreciated.  Stool sample for occult testing is not indicated.   GU: No CVA tenderness.  No bladder fullness or masses.  Patient with uncircumcised phallus. Foreskin easily retracted  Urethral meatus is patent.  No penile discharge. No penile lesions or rashes. Scrotum without lesions, cysts, rashes and/or edema.  Testicles are located scrotally bilaterally. No masses are appreciated in the testicles. Left and right epididymis are normal. Rectal: Deferred.  Skin: No rashes, bruises or suspicious lesions. Lymph: No cervical or inguinal adenopathy. Neurologic: Grossly intact, no focal deficits, moving all 4 extremities. Psychiatric: Normal mood and affect.  Laboratory Data: Lab Results  Component Value Date   WBC 5.8 04/02/2014   HGB 12.9 (L) 04/02/2014   HCT 39.5 (L) 04/02/2014   MCV 91 04/02/2014   PLT 204 04/02/2014    Lab Results  Component Value Date   CREATININE 1.20 04/02/2014    Lab Results  Component Value Date   PSA 8.81 (H) 09/26/2016   Procedure Patient's left corpus cavernosum is identified.  An area near the base of the penis is cleansed with rubbing alcohol.  Careful to avoid the dorsal vein, 5 mcg of Trimix is injected at a 90 degree  angle into the left corpus cavernosum near the base of the penis.  Patient experienced a firm erection in 15 minutes.    Assessment & Plan:    1. Erectile dysfunction             - SHIM score is 21             - Trimix test dose given today  - Advised patient of the condition of priapism, painful erection lasting for more than four hours, and to contact the office immediately or seek treatment in the ED  2. Prostate cancer             - Gleason 3 + 3 in 2/12 cores with PSA of 6.86,  on active surveillance - has not had a repeated biopsy             - current PSA 8.81             - Patient will be schedule for a TRUSPBx of prostate.  The procedure is explained and the risks involved, such as blood in urine, blood in stool, blood in semen, infection, urinary retention, and on rare occasions sepsis and death.  Patient understands the risks as explained to him and he wishes to proceed.  Patient is on ASA and is advised to discontinue the medication ten days prior to the biopsy.   Return for prostate biopsy and Trimix teaching; need to be different appointments.  These notes generated with voice recognition software. I apologize for typographical errors.  Zara Council, [facility] [address], [place], [place] [address] [phone]

## 2016-10-13 ENCOUNTER — Telehealth: Payer: Self-pay

## 2016-10-13 ENCOUNTER — Ambulatory Visit: Payer: BLUE CROSS/BLUE SHIELD | Admitting: Urology

## 2016-10-13 ENCOUNTER — Encounter: Payer: Self-pay | Admitting: Urology

## 2016-10-13 VITALS — BP 120/69 | HR 52 | Ht 72.0 in | Wt 211.5 lb

## 2016-10-13 DIAGNOSIS — C61 Malignant neoplasm of prostate: Secondary | ICD-10-CM | POA: Diagnosis not present

## 2016-10-13 DIAGNOSIS — N529 Male erectile dysfunction, unspecified: Secondary | ICD-10-CM

## 2016-10-13 NOTE — Telephone Encounter (Signed)
That is fine.  This is a better result than a priapism.  It is best to titrate up slowly to prevent a priapism from happening.  We will inject more when he comes back in for teaching.

## 2016-10-13 NOTE — Telephone Encounter (Signed)
The pt was notified and appt scheduled for Trimix teaching.

## 2016-10-13 NOTE — Telephone Encounter (Signed)
The pt was seen in the office today for his first Trimix injection. He called back today complaining that his erection went away before he made it home. He said he didn't even make it 2 miles. Please advise

## 2016-10-13 NOTE — Telephone Encounter (Signed)
Message has been handled by [place] and Sharyn Lull.

## 2016-10-24 NOTE — Progress Notes (Signed)
10/27/2016 9:05 AM   Kirk Wright 01-05-1946 [id number]  Referring provider: Sherrin Daisy, MD [place], [place] [address]  Chief Complaint  Patient presents with  . Erectile Dysfunction    TriMix injection    HPI: 61 AAM who presents today for instruction on Trimix injections.    Erectile dysfunction His SHIM score is 21, which is mild ED.   He has been having difficulty with erections for twenty years.  His major complaint is maintaining an erection.   His libido is preserved.   His risk factors for ED are age, BPH, prostate cancer, HTN and sleep apnea.  He denies any painful erections or curvatures with his erections.   He is still having spontaneous erections.  He has tried Cialis and Viagra in the past with minimal results.                   SHIM    Row Name 09/26/16 1436             SHIM: Over the last 6 months:   How do you rate your confidence that you could get and keep an erection? Very Low     When you had erections with sexual stimulation, how often were your erections hard enough for penetration (entering your partner)? Almost Always or Always     During sexual intercourse, how often were you able to maintain your erection after you had penetrated (entered) your partner? Almost Always or Always     During sexual intercourse, how difficult was it to maintain your erection to completion of intercourse? Not Difficult     When you attempted sexual intercourse, how often was it satisfactory for you? Almost Always or Always           SHIM Total Score   SHIM 21        Score: 1-7 Severe ED 8-11 Moderate ED 12-16 Mild-Moderate ED 17-21 Mild ED 22-25 No ED  He presents today for try mix injection teaching.  He states he did not receive a satisfactory erection with 5 mcg of Trimix.    Prostate cancer Patient underwent a prostate biopsy on 11/14/2012 with Dr. [name] for a PSA of 6.86.  58 gram prostate.   Results were Gleason 3 + 3 cancer in 2/12 cores.  2% core of lateral mid and 8% of lateral apex.  He was then referred to Dr. [name] on 01/25/2013 for possible robotic prostatectomy.  Patient chose to manage his prostate cancer with active surveillance.  He was to RTC in 6 months, but he was lost to follow up.  PSA 8.81 ng/mL in 09/2016.     PMH: Past Medical History:  Diagnosis Date  . Cataract   . COPD (chronic obstructive pulmonary disease) ([place])   . DJD (degenerative joint disease)   . Elevated PSA   . Erectile dysfunction   . Hypertension   . Neutropenia ([place])   . Obesity   . Obesity   . Pneumonia   . POAG (primary open-angle glaucoma)   . Pseudophakia of both eyes   . Sickle cell trait ([place])   . Sleep apnea   . Vitamin B 12 deficiency     Surgical History: Past Surgical History:  Procedure Laterality Date  . COLONOSCOPY WITH PROPOFOL N/A 02/23/2015   Procedure: COLONOSCOPY WITH PROPOFOL;  Surgeon: [name], MD;  Location: [facility] ENDOSCOPY;  Service: Endoscopy;  Laterality: N/A;  . EYE SURGERY    .  GLAUCOMA SURGERY    . HERNIA REPAIR    . PROSTRATE BX      Home Medications:  Allergies as of 10/27/2016      Reactions   Contrast Media [iodinated Diagnostic Agents] Rash      Medication List       Accurate as of 10/27/16  9:05 AM. Always use your most recent med list.          acetaZOLAMIDE 250 MG tablet Commonly known as:  DIAMOX Take 250 mg by mouth 3 (three) times daily.   aspirin 81 MG tablet Take 81 mg by mouth daily.   bimatoprost 0.03 % ophthalmic solution Commonly known as:  LUMIGAN 1 drop at bedtime.   brimonidine-timolol 0.2-0.5 % ophthalmic solution Commonly known as:  COMBIGAN Place 1 drop into both eyes every 12 (twelve) hours.   brinzolamide 1 % ophthalmic suspension Commonly known as:  AZOPT 1 drop 3 (three) times daily.   ferrous sulfate 325 (65 FE) MG tablet Take 325 mg by mouth daily with breakfast.   ibuprofen 200 MG  tablet Commonly known as:  ADVIL,MOTRIN Take 200 mg by mouth every 6 (six) hours as needed.   lisinopril-hydrochlorothiazide 10-12.5 MG tablet Commonly known as:  PRINZIDE,ZESTORETIC Take 1 tablet by mouth daily.   PROAIR HFA 108 (90 Base) MCG/ACT inhaler Generic drug:  albuterol TAKE 2 PUFFS INTO THE LUNGS EVERY 6 HOURS AS NEEDED FOR WHEEZING OR SHORTNESS OF BREATH   SYMBICORT 160-4.5 MCG/ACT inhaler Generic drug:  budesonide-formoterol Inhale into the lungs.   tadalafil 5 MG tablet Commonly known as:  CIALIS Take 5 mg by mouth daily as needed for erectile dysfunction.       Allergies:  Allergies  Allergen Reactions  . Contrast Media [Iodinated Diagnostic Agents] Rash    Family History: Family History  Problem Relation Age of Onset  . Kidney cancer Neg Hx   . Kidney disease Neg Hx   . Prostate cancer Neg Hx   . Bladder Cancer Neg Hx     Social History:  reports that he has quit smoking. He has quit using smokeless tobacco. He reports that he does not drink alcohol or use drugs.  ROS: UROLOGY Frequent Urination?: No Hard to postpone urination?: No Burning/pain with urination?: No Get up at night to urinate?: No Leakage of urine?: No Urine stream starts and stops?: No Trouble starting stream?: No Do you have to strain to urinate?: No Blood in urine?: No Urinary tract infection?: No Sexually transmitted disease?: No Injury to kidneys or bladder?: No Painful intercourse?: No Weak stream?: No Erection problems?: Yes Penile pain?: No  Gastrointestinal Nausea?: No Vomiting?: No Indigestion/heartburn?: No Diarrhea?: No Constipation?: No  Constitutional Fever: No Night sweats?: No Weight loss?: No Fatigue?: No  Skin Skin rash/lesions?: No Itching?: No  Eyes Blurred vision?: No Double vision?: No  Ears/Nose/Throat Sore throat?: No Sinus problems?: No  Hematologic/Lymphatic Swollen glands?: No Easy bruising?: No  Cardiovascular Leg  swelling?: No Chest pain?: No  Respiratory Cough?: No Shortness of breath?: No  Endocrine Excessive thirst?: No  Musculoskeletal Back pain?: No Joint pain?: No  Neurological Headaches?: No Dizziness?: No  Psychologic Depression?: No Anxiety?: No  Physical Exam: BP 132/61   Pulse 60   Ht 6' (1.829 m)   Wt 210 lb (95.3 kg)   BMI 28.48 kg/m   Constitutional: Well nourished. Alert and oriented, No acute distress. HEENT: [place] AT, moist mucus membranes. Trachea midline, no masses. Cardiovascular: No clubbing, cyanosis, or edema.  Respiratory: Normal respiratory effort, no increased work of breathing. GI: Abdomen is soft, non tender, non distended, no abdominal masses. Liver and spleen not palpable.  No hernias appreciated.  Stool sample for occult testing is not indicated.   GU: No CVA tenderness.  No bladder fullness or masses.  Patient with uncircumcised phallus. Foreskin easily retracted  Urethral meatus is patent.  No penile discharge. No penile lesions or rashes. Scrotum without lesions, cysts, rashes and/or edema.  Testicles are located scrotally bilaterally. No masses are appreciated in the testicles. Left and right epididymis are normal. Rectal: Deferred.  Skin: No rashes, bruises or suspicious lesions. Lymph: No cervical or inguinal adenopathy. Neurologic: Grossly intact, no focal deficits, moving all 4 extremities. Psychiatric: Normal mood and affect.  Laboratory Data: Lab Results  Component Value Date   WBC 5.8 04/02/2014   HGB 12.9 (L) 04/02/2014   HCT 39.5 (L) 04/02/2014   MCV 91 04/02/2014   PLT 204 04/02/2014    Lab Results  Component Value Date   CREATININE 1.20 04/02/2014    Lab Results  Component Value Date   PSA 8.81 (H) 09/26/2016   Procedure Patient presents today for instructions on Trimix injections and to demonstrate his proficiency with the injections.  The patient washed his hands.  He then was instructed to tap the syringe to allow  air bubbles to float to the top of the syringe. He then depressed the plunger to allow the air to escape.  He then selected an injection site at the right base of his penis between 9 and 11:00 positions between the base and midportion of the penis. He was careful to avoid the 6 and 12:00 positions and any surface veins or arteries.  He then grafts the head of the penis towards the left with light tension.  He then cleansed the area using an alcohol swab. He then took the syringe and injected at a 90 angle into the corpus cavernosum 6 g of Trimix (PAPA 30 mg, PHENT 1 mg, Prostaglandin 10 mcg Lot # [id number]  , exp. 11/14/2016).  He then applied compression to the injection site.  He tolerated the procedure well. He stated he had good comfort level with the process of the injections.     Assessment & Plan:    1. Erectile dysfunction             - SHIM score is 21             - Trimix injection teaching completed today  - Patient advised not to increase the dose by 1 mcg with each injection if he is not experiencing satisfactory erections  - Advised patient of the condition of priapism, painful erection lasting for more than four hours, and to contact the office immediately or seek treatment in the ED  2. Prostate cancer             - Gleason 3 + 3 in 2/12 cores with PSA of 6.86,  on active surveillance - has not had a repeated biopsy             - current PSA 8.81             - Patient will be schedule for a TRUSPBx of prostate.  The procedure is explained and the risks involved, such as blood in urine, blood in stool, blood in semen, infection, urinary retention, and on rare occasions sepsis and death.  Patient understands the risks as explained to him and he wishes  to proceed.  Patient is on ASA and is advised to discontinue the medication ten days prior to the biopsy.   Return for schedule TRUSPBx of prostate.  These notes generated with voice recognition software. I apologize for  typographical errors.  Zara Council, [facility] [address], [place], [place] [address] [phone]

## 2016-10-27 ENCOUNTER — Ambulatory Visit (INDEPENDENT_AMBULATORY_CARE_PROVIDER_SITE_OTHER): Payer: BLUE CROSS/BLUE SHIELD | Admitting: Urology

## 2016-10-27 ENCOUNTER — Encounter: Payer: Self-pay | Admitting: Urology

## 2016-10-27 ENCOUNTER — Telehealth: Payer: Self-pay | Admitting: Urology

## 2016-10-27 VITALS — BP 132/61 | HR 60 | Ht 72.0 in | Wt 210.0 lb

## 2016-10-27 DIAGNOSIS — J449 Chronic obstructive pulmonary disease, unspecified: Secondary | ICD-10-CM | POA: Insufficient documentation

## 2016-10-27 DIAGNOSIS — C61 Malignant neoplasm of prostate: Secondary | ICD-10-CM

## 2016-10-27 DIAGNOSIS — N529 Male erectile dysfunction, unspecified: Secondary | ICD-10-CM | POA: Diagnosis not present

## 2016-10-27 DIAGNOSIS — E669 Obesity, unspecified: Secondary | ICD-10-CM | POA: Insufficient documentation

## 2016-10-27 DIAGNOSIS — D573 Sickle-cell trait: Secondary | ICD-10-CM | POA: Insufficient documentation

## 2016-10-27 DIAGNOSIS — E785 Hyperlipidemia, unspecified: Secondary | ICD-10-CM | POA: Insufficient documentation

## 2016-10-27 DIAGNOSIS — I10 Essential (primary) hypertension: Secondary | ICD-10-CM | POA: Insufficient documentation

## 2016-10-27 DIAGNOSIS — G4730 Sleep apnea, unspecified: Secondary | ICD-10-CM | POA: Insufficient documentation

## 2016-10-27 DIAGNOSIS — H401190 Primary open-angle glaucoma, unspecified eye, stage unspecified: Secondary | ICD-10-CM | POA: Insufficient documentation

## 2016-10-27 DIAGNOSIS — D519 Vitamin B12 deficiency anemia, unspecified: Secondary | ICD-10-CM | POA: Insufficient documentation

## 2016-10-27 DIAGNOSIS — M47816 Spondylosis without myelopathy or radiculopathy, lumbar region: Secondary | ICD-10-CM | POA: Insufficient documentation

## 2016-10-27 NOTE — Telephone Encounter (Signed)
Would you check on Kirk Wright status concerning his clearance for his biopsy?

## 2016-10-27 NOTE — Telephone Encounter (Signed)
Spoke with pt in reference to clearance and prostate bx. Made pt aware clearance was granted and still needs to set up bx appt. Reviewed pre bx instructions. Pt voiced understanding. Pt was transferred to the front to make bx appt. Instruction sheet and appt dates/times will be mailed to pt.

## 2016-10-29 ENCOUNTER — Other Ambulatory Visit: Payer: Self-pay

## 2016-11-18 ENCOUNTER — Other Ambulatory Visit: Payer: Self-pay | Admitting: Urology

## 2016-11-18 ENCOUNTER — Encounter: Payer: Self-pay | Admitting: Urology

## 2016-11-18 ENCOUNTER — Ambulatory Visit (INDEPENDENT_AMBULATORY_CARE_PROVIDER_SITE_OTHER): Payer: BLUE CROSS/BLUE SHIELD | Admitting: Urology

## 2016-11-18 VITALS — BP 118/67 | HR 55 | Ht 72.0 in | Wt 203.1 lb

## 2016-11-18 DIAGNOSIS — C61 Malignant neoplasm of prostate: Secondary | ICD-10-CM

## 2016-11-18 MED ORDER — GENTAMICIN SULFATE 40 MG/ML IJ SOLN
80.0000 mg | Freq: Once | INTRAMUSCULAR | Status: AC
  Administered 2016-11-18: 80 mg via INTRAMUSCULAR

## 2016-11-18 MED ORDER — LIDOCAINE HCL 2 % EX GEL
1.0000 "application " | Freq: Once | CUTANEOUS | Status: AC
  Administered 2016-11-18: 1 via URETHRAL

## 2016-11-18 MED ORDER — LEVOFLOXACIN 500 MG PO TABS
500.0000 mg | ORAL_TABLET | Freq: Once | ORAL | Status: AC
  Administered 2016-11-18: 500 mg via ORAL

## 2016-11-18 NOTE — Progress Notes (Signed)
Patient returns for prostate biopsy in surveillance of prostate cancer. Patient underwent a prostate biopsy on 11/14/2012 with Dr. [name] for a PSA of 6.86. 58 gram prostate. Results were Gleason 3+3 cancer in 2/12 cores. 2% core of lateral mid and 8% of lateral apex. He was then referred to Dr. [name] on 01/25/2013 for possible robotic prostatectomy. Patient chose to manage his prostate cancer with active surveillance. He was to RTC in 6 months, but he was lost to follow up. His PSA rose to 8.81 ng/mL Apr 2018.    Prostate Biopsy Procedure   Informed consent was obtained after discussing risks/benefits of the procedure.  A time out was performed to ensure correct patient identity.  Pre-Procedure: - Last PSA Level:  Lab Results  Component Value Date   PSA 8.81 (H) 09/26/2016   - Gentamicin given prophylactically - Levaquin 500 mg administered PO -DRE - no obvious hard area or nodule. Large prostate.  -Transrectal Ultrasound performed revealing a 78 - 135 gram prostate (+/- median lobe) -Maybe hypoechoic area left lateral base. Large median lobe noted.  Procedure: - Prostate block performed using 10 cc 1% lidocaine and biopsies taken from sextant areas, a total of 12 under ultrasound guidance.  Post-Procedure: - Patient tolerated the procedure well - He was counseled to seek immediate medical attention if experiences any severe pain, significant bleeding, or fevers - Return in one week to discuss biopsy results

## 2016-11-22 LAB — PATHOLOGY REPORT

## 2016-11-24 ENCOUNTER — Other Ambulatory Visit: Payer: Self-pay | Admitting: Urology

## 2016-11-26 ENCOUNTER — Telehealth: Payer: Self-pay

## 2016-11-26 DIAGNOSIS — C61 Malignant neoplasm of prostate: Secondary | ICD-10-CM

## 2016-11-26 NOTE — Telephone Encounter (Signed)
Kirk Aloe, MD  [place]        Notify patient -- good news -- we did not see any cancer on his prostate biopsy. All the biopsies were negative. I think it is safe for him to continue surveillance of the very small prostate cancer they found in his prostate a few years ago. He should see [place] in 6 months with a PSA prior.     Spoke to patient. Gave results and instructions. Patient verbalized understanding. Scheduled 6 month lab appt and f/u OV. Placed lab order.

## 2016-12-02 ENCOUNTER — Ambulatory Visit: Payer: BLUE CROSS/BLUE SHIELD

## 2016-12-12 ENCOUNTER — Telehealth: Payer: Self-pay | Admitting: Urology

## 2016-12-12 NOTE — Telephone Encounter (Signed)
Patient may have a refill of his Trimix and we can increase the dose to 7 mcg.

## 2016-12-12 NOTE — Telephone Encounter (Signed)
Patient is asking for more trimix and wants to know if you can give him a higher dose of the trimix?   Sharyn Lull

## 2016-12-15 NOTE — Telephone Encounter (Signed)
Called Trimix into pharmacy and per talking to [facility] she states patient can go to 88mcg because after speaking with patient that is what he has been doing. Patient informed medication called in.

## 2017-03-12 ENCOUNTER — Other Ambulatory Visit: Payer: Self-pay | Admitting: Orthopedic Surgery

## 2017-03-12 DIAGNOSIS — M545 Low back pain, unspecified: Secondary | ICD-10-CM

## 2017-03-12 DIAGNOSIS — M4316 Spondylolisthesis, lumbar region: Secondary | ICD-10-CM

## 2017-03-12 DIAGNOSIS — M5440 Lumbago with sciatica, unspecified side: Secondary | ICD-10-CM

## 2017-03-12 DIAGNOSIS — M5137 Other intervertebral disc degeneration, lumbosacral region: Secondary | ICD-10-CM

## 2017-03-19 ENCOUNTER — Ambulatory Visit
Admission: RE | Admit: 2017-03-19 | Discharge: 2017-03-19 | Disposition: A | Discharge status: Home / Self Care | Payer: BLUE CROSS/BLUE SHIELD | Source: Ambulatory Visit | Attending: Orthopedic Surgery | Admitting: Orthopedic Surgery

## 2017-03-19 DIAGNOSIS — M5136 Other intervertebral disc degeneration, lumbar region: Secondary | ICD-10-CM | POA: Diagnosis not present

## 2017-03-19 DIAGNOSIS — M545 Low back pain, unspecified: Secondary | ICD-10-CM

## 2017-03-19 DIAGNOSIS — M5440 Lumbago with sciatica, unspecified side: Secondary | ICD-10-CM

## 2017-03-19 DIAGNOSIS — M5126 Other intervertebral disc displacement, lumbar region: Secondary | ICD-10-CM | POA: Diagnosis not present

## 2017-03-19 DIAGNOSIS — M5137 Other intervertebral disc degeneration, lumbosacral region: Secondary | ICD-10-CM | POA: Insufficient documentation

## 2017-03-19 DIAGNOSIS — M4316 Spondylolisthesis, lumbar region: Secondary | ICD-10-CM | POA: Diagnosis not present

## 2017-05-21 ENCOUNTER — Other Ambulatory Visit: Payer: Self-pay

## 2017-05-25 ENCOUNTER — Other Ambulatory Visit: Payer: BLUE CROSS/BLUE SHIELD

## 2017-05-25 DIAGNOSIS — C61 Malignant neoplasm of prostate: Secondary | ICD-10-CM

## 2017-05-26 LAB — PSA: PROSTATE SPECIFIC AG, SERUM: 8 ng/mL — AB (ref 0.0–4.0)

## 2017-05-28 ENCOUNTER — Ambulatory Visit: Payer: Self-pay | Admitting: Urology

## 2017-06-03 ENCOUNTER — Telehealth: Payer: Self-pay

## 2017-06-03 NOTE — Telephone Encounter (Signed)
Kirk Aloe, MD  [name], LPN        Notify patient -- PSA has risen slightly. F/u as planned. Give result.    Spoke with pt in reference to PSA results and to f/u as planned. Pt voiced understanding.

## 2017-06-04 ENCOUNTER — Ambulatory Visit: Payer: BLUE CROSS/BLUE SHIELD

## 2017-06-05 ENCOUNTER — Ambulatory Visit: Payer: BLUE CROSS/BLUE SHIELD | Admitting: Urology

## 2017-06-05 ENCOUNTER — Encounter: Payer: Self-pay | Admitting: Urology

## 2017-06-05 VITALS — BP 121/64 | HR 75 | Ht 72.0 in | Wt 211.5 lb

## 2017-06-05 DIAGNOSIS — C61 Malignant neoplasm of prostate: Secondary | ICD-10-CM | POA: Diagnosis not present

## 2017-06-05 DIAGNOSIS — N529 Male erectile dysfunction, unspecified: Secondary | ICD-10-CM

## 2017-06-05 NOTE — Progress Notes (Signed)
06/05/2017 8:54 AM   Kirk Wright [birth year] [id number]  Referring provider: Sherrin Daisy, MD No address on file  Chief Complaint  Patient presents with  . Elevated PSA    HPI: The patient is a 71 year old gentleman presents today for follow-up of very low risk prostate cancer and erectile dysfunction.  1.  Very low risk prostate cancer Patient underwent a prostate biopsy on 11/14/2012 with Dr. [name] for a PSA of 6.86. 58 gram prostate. Results were Gleason 3+3 cancer in 2/12 cores. 2% core of lateral mid and 8% of lateral apex. He was then referred to Dr. [name] on 01/25/2013 for possible robotic prostatectomy. Patient chose to manage his prostate cancer with active surveillance. He was to RTC in 6 months, but he was lost to follow up. His PSA rose to 8.81 ng/mL Apr 2018.  Repeat prostate biopsy in May 2018 was negative for disease.  Repeat PSA was 8.0 in December 2018 per  2. Erectile dysfunction The patient currently is on Trymex for his erectile dysfunction. He is currently taking 7 mcg.  This works well for him when he is uses.   PMH: Past Medical History:  Diagnosis Date  . Cataract   . COPD (chronic obstructive pulmonary disease) ([place])   . DJD (degenerative joint disease)   . Elevated PSA   . Erectile dysfunction   . Hypertension   . Neutropenia ([place])   . Obesity   . Obesity   . Pneumonia   . POAG (primary open-angle glaucoma)   . Pseudophakia of both eyes   . Sickle cell trait ([place])   . Sleep apnea   . Vitamin B 12 deficiency     Surgical History: Past Surgical History:  Procedure Laterality Date  . COLONOSCOPY WITH PROPOFOL N/A 02/23/2015   Procedure: COLONOSCOPY WITH PROPOFOL;  Surgeon: [name], MD;  Location[facility] ENDOSCOPY;  Service: Endoscopy;  Laterality: N/A;  . EYE SURGERY    . GLAUCOMA SURGERY    . HERNIA REPAIR    . PROSTRATE BX      Home Medications:  Allergies as of 06/05/2017      Reactions   Contrast Media  [iodinated Diagnostic Agents] Rash      Medication List        Accurate as of 06/05/17  8:54 AM. Always use your most recent med list.          acetaZOLAMIDE 250 MG tablet Commonly known as:  DIAMOX Take 250 mg by mouth 3 (three) times daily.   aspirin 81 MG tablet Take 81 mg by mouth daily.   bimatoprost 0.03 % ophthalmic solution Commonly known as:  LUMIGAN 1 drop at bedtime.   brimonidine-timolol 0.2-0.5 % ophthalmic solution Commonly known as:  COMBIGAN Place 1 drop into both eyes every 12 (twelve) hours.   brinzolamide 1 % ophthalmic suspension Commonly known as:  AZOPT 1 drop 3 (three) times daily.   ferrous sulfate 325 (65 FE) MG tablet Take 325 mg by mouth daily with breakfast.   gabapentin 100 MG capsule Commonly known as:  NEURONTIN Take 100 mg by mouth 3 (three) times daily.   ibuprofen 200 MG tablet Commonly known as:  ADVIL,MOTRIN Take 200 mg by mouth every 6 (six) hours as needed.   lisinopril-hydrochlorothiazide 10-12.5 MG tablet Commonly known as:  PRINZIDE,ZESTORETIC Take 1 tablet by mouth daily.   meloxicam 15 MG tablet Commonly known as:  MOBIC Take 15 mg by mouth daily.   PROAIR HFA 108 (90  Base) MCG/ACT inhaler Generic drug:  albuterol TAKE 2 PUFFS INTO THE LUNGS EVERY 6 HOURS AS NEEDED FOR WHEEZING OR SHORTNESS OF BREATH   SYMBICORT 160-4.5 MCG/ACT inhaler Generic drug:  budesonide-formoterol Inhale into the lungs.   tadalafil 5 MG tablet Commonly known as:  CIALIS Take 5 mg by mouth daily as needed for erectile dysfunction.       Allergies:  Allergies  Allergen Reactions  . Contrast Media [Iodinated Diagnostic Agents] Rash    Family History: Family History  Problem Relation Age of Onset  . Kidney cancer Neg Hx   . Kidney disease Neg Hx   . Prostate cancer Neg Hx   . Bladder Cancer Neg Hx     Social History:  reports that he has quit smoking. He has quit using smokeless tobacco. He reports that he does not drink  alcohol or use drugs.  ROS: UROLOGY Frequent Urination?: No Hard to postpone urination?: No Burning/pain with urination?: No Get up at night to urinate?: No Leakage of urine?: No Urine stream starts and stops?: No Trouble starting stream?: No Do you have to strain to urinate?: No Blood in urine?: No Urinary tract infection?: No Sexually transmitted disease?: No Injury to kidneys or bladder?: No Painful intercourse?: No Weak stream?: No Erection problems?: No Penile pain?: No  Gastrointestinal Nausea?: No Vomiting?: No Indigestion/heartburn?: No Diarrhea?: No Constipation?: No  Constitutional Fever: No Night sweats?: No Weight loss?: No Fatigue?: No  Skin Skin rash/lesions?: No Itching?: No  Eyes Blurred vision?: No Double vision?: No  Ears/Nose/Throat Sore throat?: No Sinus problems?: No  Hematologic/Lymphatic Swollen glands?: No Easy bruising?: No  Cardiovascular Leg swelling?: No Chest pain?: No  Respiratory Cough?: No Shortness of breath?: No  Endocrine Excessive thirst?: No  Musculoskeletal Back pain?: Yes Joint pain?: No  Neurological Headaches?: No Dizziness?: No  Psychologic Depression?: No Anxiety?: No  Physical Exam: BP 121/64 (BP Location: Right Arm, Patient Position: Sitting, Cuff Size: Normal)   Pulse 75   Ht 6' (1.829 m)   Wt 211 lb 8 oz (95.9 kg)   BMI 28.68 kg/m   Constitutional:  Alert and oriented, No acute distress. HEENT: [place] AT, moist mucus membranes.  Trachea midline, no masses. Cardiovascular: No clubbing, cyanosis, or edema. Respiratory: Normal respiratory effort, no increased work of breathing. GI: Abdomen is soft, nontender, nondistended, no abdominal masses GU: No CVA tenderness.  Skin: No rashes, bruises or suspicious lesions. Lymph: No cervical or inguinal adenopathy. Neurologic: Grossly intact, no focal deficits, moving all 4 extremities. Psychiatric: Normal mood and affect.  Laboratory Data: Lab  Results  Component Value Date   WBC 5.8 04/02/2014   HGB 12.9 (L) 04/02/2014   HCT 39.5 (L) 04/02/2014   MCV 91 04/02/2014   PLT 204 04/02/2014    Lab Results  Component Value Date   CREATININE 1.20 04/02/2014    Lab Results  Component Value Date   PSA 8.81 (H) 09/26/2016    No results found for: TESTOSTERONE  No results found for: HGBA1C  Urinalysis No results found for: COLORURINE, APPEARANCEUR, LABSPEC, PHURINE, GLUCOSEU, HGBUR, BILIRUBINUR, KETONESUR, PROTEINUR, UROBILINOGEN, NITRITE, LEUKOCYTESUR   Assessment & Plan:    1.  Very low risk prostate cancer Continue active surveillance.  PSA stable.  Follow-up in 6 months for PSA/DRE.  2.  Erectile dysfunction -Continue trimix  Return in about 6 months (around 12/04/2017) for PSA prior.  Nickie Retort, MD  [facility] [address], [place], [place] [address] [phone]  [phone]  

## 2017-06-08 ENCOUNTER — Telehealth: Payer: Self-pay | Admitting: Urology

## 2017-06-08 NOTE — Telephone Encounter (Signed)
Refills given.

## 2017-06-08 NOTE — Telephone Encounter (Signed)
Pt calling asking that [facility] call in a refill for Trimix.  Pt states he is out of his medication.  Please advise. Thanks.

## 2017-08-11 ENCOUNTER — Telehealth: Payer: Self-pay | Admitting: Urology

## 2017-08-13 ENCOUNTER — Ambulatory Visit: Payer: BLUE CROSS/BLUE SHIELD | Admitting: Urology

## 2017-09-11 ENCOUNTER — Ambulatory Visit (INDEPENDENT_AMBULATORY_CARE_PROVIDER_SITE_OTHER): Payer: BLUE CROSS/BLUE SHIELD | Admitting: Urology

## 2017-09-11 ENCOUNTER — Encounter: Payer: Self-pay | Admitting: Urology

## 2017-09-11 VITALS — BP 134/67 | HR 58 | Wt 211.0 lb

## 2017-09-11 DIAGNOSIS — C61 Malignant neoplasm of prostate: Secondary | ICD-10-CM | POA: Diagnosis not present

## 2017-09-11 DIAGNOSIS — N5203 Combined arterial insufficiency and corporo-venous occlusive erectile dysfunction: Secondary | ICD-10-CM | POA: Diagnosis not present

## 2017-09-11 NOTE — Progress Notes (Signed)
09/11/2017 1:37 PM   Kirk Wright 09/22/1945 [id number]  Referring provider: [name], NP [address] [place], [place] [address]  Chief Complaint  Patient presents with  . Erectile Dysfunction    HPI: 72 year old male who presents today primarily to discuss placement of a penile prosthesis.  Very low risk prostate cancer Patient underwent a prostate biopsy on 11/14/2012 with Dr. [name] for a PSA of 6.86. 58 gram prostate. Results were Gleason 3+3 cancer in 2/12 cores. 2% core of lateral mid and 8% of lateral apex. He was then referred to Dr. [name] on 01/25/2013 for possible robotic prostatectomy. Patient chose to manage his prostate cancer with active surveillance. He was to RTC in 6 months, but he was lost to follow up. HisPSArose to8.81 ng/mLApr2018. Repeat prostate biopsy in May 2018 was negative for disease.  Repeat PSA was 8.0 in December 2018.  Due for repeat PSA in 11/2017.  Erectile dysfunction The patient currently is on Trimex for his erectile dysfunction. He is currently taking 9 mcg.   Reports that he has been having trouble with injections.  Also occasions, this dose is sufficient to achieve an erection but only last about 5 minutes.  More recently, he has been using a penile ring in combination with his injections with the last erections to last longer and remains fuller after about 5-10 minutes, he becomes uncomfortable and feels like he needs to remove the ring.  Additionally, his girlfriend finds the whole process unappealing and lacking spontaneity.  Most recently, he refilled his prescription this time with greater than 10 mcg in each dose which is not yet tried.  He is never tried any additional formulations of Trimix other than the standard.  He is interested in discussing penile prosthesis today.  He is nondiabetic.   SHIM    Row Name 09/11/17 1341         SHIM: Over the last 6 months:   How do you rate your confidence  that you could get and keep an erection?  Very Low     When you had erections with sexual stimulation, how often were your erections hard enough for penetration (entering your partner)?  Almost Never or Never     During sexual intercourse, how often were you able to maintain your erection after you had penetrated (entered) your partner?  Almost Never or Never     During sexual intercourse, how difficult was it to maintain your erection to completion of intercourse?  Extremely Difficult     When you attempted sexual intercourse, how often was it satisfactory for you?  Almost Never or Never       SHIM Total Score   SHIM  5         PMH: Past Medical History:  Diagnosis Date  . Cataract   . COPD (chronic obstructive pulmonary disease) ([facility])   . DJD (degenerative joint disease)   . Elevated PSA   . Erectile dysfunction   . Hypertension   . Neutropenia ([place])   . Obesity   . Obesity   . Pneumonia   . POAG (primary open-angle glaucoma)   . Pseudophakia of both eyes   . Sickle cell trait ([place])   . Sleep apnea   . Vitamin B 12 deficiency     Surgical History: Past Surgical History:  Procedure Laterality Date  . COLONOSCOPY WITH PROPOFOL N/A 02/23/2015   Procedure: COLONOSCOPY WITH PROPOFOL;  Surgeon: [name], MD;  Location: [facility]  ENDOSCOPY;  Service: Endoscopy;  Laterality: N/A;  . EYE SURGERY    . GLAUCOMA SURGERY    . HERNIA REPAIR    . PROSTRATE BX      Home Medications:  Allergies as of 09/11/2017      Reactions   Contrast Media [iodinated Diagnostic Agents] Rash      Medication List        Accurate as of 09/11/17  1:37 PM. Always use your most recent med list.          acetaZOLAMIDE 250 MG tablet Commonly known as:  DIAMOX Take 250 mg by mouth 3 (three) times daily.   aspirin 81 MG tablet Take 81 mg by mouth daily.   bimatoprost 0.03 % ophthalmic solution Commonly known as:  LUMIGAN 1 drop at bedtime.   brimonidine-timolol 0.2-0.5 % ophthalmic  solution Commonly known as:  COMBIGAN Place 1 drop into both eyes every 12 (twelve) hours.   brinzolamide 1 % ophthalmic suspension Commonly known as:  AZOPT 1 drop 3 (three) times daily.   ferrous sulfate 325 (65 FE) MG tablet Take 325 mg by mouth daily with breakfast.   gabapentin 100 MG capsule Commonly known as:  NEURONTIN Take 100 mg by mouth 3 (three) times daily.   ibuprofen 200 MG tablet Commonly known as:  ADVIL,MOTRIN Take 200 mg by mouth every 6 (six) hours as needed.   lisinopril-hydrochlorothiazide 10-12.5 MG tablet Commonly known as:  PRINZIDE,ZESTORETIC Take 1 tablet by mouth daily.   meloxicam 15 MG tablet Commonly known as:  MOBIC Take 15 mg by mouth daily.   PROAIR HFA 108 (90 Base) MCG/ACT inhaler Generic drug:  albuterol TAKE 2 PUFFS INTO THE LUNGS EVERY 6 HOURS AS NEEDED FOR WHEEZING OR SHORTNESS OF BREATH   SYMBICORT 160-4.5 MCG/ACT inhaler Generic drug:  budesonide-formoterol Inhale into the lungs.   tadalafil 5 MG tablet Commonly known as:  CIALIS Take 5 mg by mouth daily as needed for erectile dysfunction.   tamsulosin 0.4 MG Caps capsule Commonly known as:  FLOMAX TAKE 1 CAPSULE (0.4 MG TOTAL) BY MOUTH ONCE DAILY. TAKE 30 MINUTES AFTER SAME MEAL EACH DAY.       Allergies:  Allergies  Allergen Reactions  . Contrast Media [Iodinated Diagnostic Agents] Rash    Family History: Family History  Problem Relation Age of Onset  . Kidney cancer Neg Hx   . Kidney disease Neg Hx   . Prostate cancer Neg Hx   . Bladder Cancer Neg Hx     Social History:  reports that he has quit smoking. He has quit using smokeless tobacco. He reports that he does not drink alcohol or use drugs.  ROS: UROLOGY Frequent Urination?: No Hard to postpone urination?: No Burning/pain with urination?: No Get up at night to urinate?: No Leakage of urine?: No Urine stream starts and stops?: No Trouble starting stream?: No Do you have to strain to urinate?:  No Blood in urine?: No Urinary tract infection?: No Sexually transmitted disease?: No Injury to kidneys or bladder?: No Painful intercourse?: No Weak stream?: No Erection problems?: Yes Penile pain?: No  Gastrointestinal Nausea?: No Vomiting?: No Indigestion/heartburn?: No Diarrhea?: No Constipation?: No  Constitutional Fever: No Night sweats?: No Weight loss?: No Fatigue?: No  Skin Skin rash/lesions?: No Itching?: No  Eyes Blurred vision?: No Double vision?: No  Ears/Nose/Throat Sore throat?: No Sinus problems?: No  Hematologic/Lymphatic Swollen glands?: No Easy bruising?: No  Cardiovascular Leg swelling?: No Chest pain?: No  Respiratory Cough?: No  Shortness of breath?: No  Endocrine Excessive thirst?: No  Musculoskeletal Back pain?: No Joint pain?: No  Neurological Headaches?: No Dizziness?: No  Psychologic Depression?: No Anxiety?: No  Physical Exam: BP 134/67   Pulse (!) 58   Wt 211 lb (95.7 kg)   HC 72" (182.9 cm)   BMI 28.62 kg/m   Constitutional:  Alert and oriented, No acute distress. HEENT: [place] AT, moist mucus membranes.  Trachea midline, no masses. Cardiovascular: No clubbing, cyanosis, or edema. Respiratory: Normal respiratory effort, no increased work of breathing. Neurologic: Grossly intact, no focal deficits, moving all 4 extremities. Psychiatric: Normal mood and affect.  Laboratory Data: Lab Results  Component Value Date   WBC 5.8 04/02/2014   HGB 12.9 (L) 04/02/2014   HCT 39.5 (L) 04/02/2014   MCV 91 04/02/2014   PLT 204 04/02/2014    Lab Results  Component Value Date   CREATININE 1.20 04/02/2014    Lab Results  Component Value Date   PSA 8.81 (H) 09/26/2016    Urinalysis N/a  Pertinent Imaging: N/a  Assessment & Plan:    1. Prostate cancer [facility]) On active surveillance Due for repeat PSA/rectal exam in 3 months  2. Combined arterial insufficiency and corporo-venous occlusive erectile  dysfunction At this point in time, it sounds like his intracavernosal injections may need to be titrated again to be more effective.  He has not yet tried his 11 mcg dose and was encouraged to give this a try.  He may benefit from superytimix.  I also discussed the penile prostheses, as well as their risks of anesthesia required for the surgery, bleeding, infection requiring explantation, malfunction, pain, SST deformity of the glans penis, erosion, and the likely inability of being able to salvage any erectile function if the device needs to be removed and not replaced. The patient had the opportunity to have his questions answered thoroughly.   He was given literature about inflatable penile prosthesis.  He was encouraged to stop by our office in [place] to hold and play with the device.  We will also mail him literature to his home.  He will let us know if and when he like to proceed.  Follow-up in 3 months as scheduled or sooner as desired  Hollice Espy, MD  [facility] [address] [place], [place] [address] [phone]  I spent 25 min with this patient of which greater than 50% was spent in counseling and coordination of care with the patient.

## 2017-11-03 NOTE — Telephone Encounter (Signed)
Error

## 2017-11-09 DIAGNOSIS — E538 Deficiency of other specified B group vitamins: Secondary | ICD-10-CM | POA: Diagnosis not present

## 2017-11-17 DIAGNOSIS — Z961 Presence of intraocular lens: Secondary | ICD-10-CM | POA: Diagnosis not present

## 2017-11-17 DIAGNOSIS — H401133 Primary open-angle glaucoma, bilateral, severe stage: Secondary | ICD-10-CM | POA: Diagnosis not present

## 2017-11-30 ENCOUNTER — Other Ambulatory Visit: Payer: PPO

## 2017-11-30 DIAGNOSIS — C61 Malignant neoplasm of prostate: Secondary | ICD-10-CM

## 2017-12-01 LAB — PSA: Prostate Specific Ag, Serum: 9.7 ng/mL — ABNORMAL HIGH (ref 0.0–4.0)

## 2017-12-03 ENCOUNTER — Encounter: Payer: Self-pay | Admitting: Urology

## 2017-12-03 ENCOUNTER — Ambulatory Visit (INDEPENDENT_AMBULATORY_CARE_PROVIDER_SITE_OTHER): Payer: PPO | Admitting: Urology

## 2017-12-03 VITALS — BP 124/70 | HR 60 | Ht 72.0 in | Wt 218.1 lb

## 2017-12-03 DIAGNOSIS — R972 Elevated prostate specific antigen [PSA]: Secondary | ICD-10-CM | POA: Diagnosis not present

## 2017-12-03 DIAGNOSIS — N5201 Erectile dysfunction due to arterial insufficiency: Secondary | ICD-10-CM | POA: Diagnosis not present

## 2017-12-03 DIAGNOSIS — N400 Enlarged prostate without lower urinary tract symptoms: Secondary | ICD-10-CM

## 2017-12-03 NOTE — Progress Notes (Signed)
12/03/2017 8:28 AM   Kirk Wright [birth year] [id number]  Referring provider: [name], NP [address] [place], [place] [address]  Chief Complaint  Patient presents with  . Prostate Cancer    HPI:  72 year old male who presents today primarily to discuss placement of a penile prosthesis.  Very low risk prostate cancer Patient underwent a prostate biopsy on 11/14/2012 with Dr. [name] for a PSA of 6.86. 58 gram prostate. Results were Gleason 3+3 cancer in 2/12 cores. 2% core of lateral mid and 8% of lateral apex. He was then referred to Dr. [name] on 01/25/2013 for possible robotic prostatectomy. Patient chose to manage his prostate cancer with active surveillance. He was to RTC in 6 months, but he was lost to follow up. HisPSArose to8.81 ng/mLApr2018.Repeat prostate biopsy in May 2018 was negative for disease showing BPH with a 78-135 g prostate (+/- median lobe measurement). Repeat PSA was 8.0 in December 2018.   Erectile dysfunction The patient currently is on Trimex for his erectiledysfunction. He is currently taking 9 mcg.  Reports that he has been having trouble with injections.  Also occasions, this dose is sufficient to achieve an erection but only last about 5 minutes.  More recently, he has been using a penile ring in combination with his injections with the last erections to last longer and remains fuller after about 5-10 minutes, he becomes uncomfortable and feels like he needs to remove the ring.  Additionally, his girlfriend finds the whole process unappealing and lacking spontaneity.  He returns and is doing well. PSA has risen to 9.7. He met with Dr. Erlene Quan to consider IPP. He uses Trimix.    PMH: Past Medical History:  Diagnosis Date  . Cataract   . COPD (chronic obstructive pulmonary disease) ([place])   . DJD (degenerative joint disease)   . Elevated PSA   . Erectile dysfunction   . Hypertension   . Neutropenia ([place])   .  Obesity   . Obesity   . Pneumonia   . POAG (primary open-angle glaucoma)   . Pseudophakia of both eyes   . Sickle cell trait ([place])   . Sleep apnea   . Vitamin B 12 deficiency     Surgical History: Past Surgical History:  Procedure Laterality Date  . COLONOSCOPY WITH PROPOFOL N/A 02/23/2015   Procedure: COLONOSCOPY WITH PROPOFOL;  Surgeon: [name], MD;  Location[facility] ENDOSCOPY;  Service: Endoscopy;  Laterality: N/A;  . EYE SURGERY    . GLAUCOMA SURGERY    . HERNIA REPAIR    . PROSTRATE BX      Home Medications:  Allergies as of 12/03/2017      Reactions   Contrast Media [iodinated Diagnostic Agents] Rash      Medication List        Accurate as of 12/03/17  8:28 AM. Always use your most recent med list.          acetaZOLAMIDE 250 MG tablet Commonly known as:  DIAMOX Take 250 mg by mouth 3 (three) times daily.   aspirin 81 MG tablet Take 81 mg by mouth daily.   bimatoprost 0.03 % ophthalmic solution Commonly known as:  LUMIGAN 1 drop at bedtime.   brimonidine-timolol 0.2-0.5 % ophthalmic solution Commonly known as:  COMBIGAN Place 1 drop into both eyes every 12 (twelve) hours.   brinzolamide 1 % ophthalmic suspension Commonly known as:  AZOPT 1 drop 3 (three) times daily.   ferrous sulfate 325 (65 FE)  MG tablet Take 325 mg by mouth daily with breakfast.   gabapentin 100 MG capsule Commonly known as:  NEURONTIN Take 100 mg by mouth 3 (three) times daily.   ibuprofen 200 MG tablet Commonly known as:  ADVIL,MOTRIN Take 200 mg by mouth every 6 (six) hours as needed.   lisinopril-hydrochlorothiazide 10-12.5 MG tablet Commonly known as:  PRINZIDE,ZESTORETIC Take 1 tablet by mouth daily.   meloxicam 15 MG tablet Commonly known as:  MOBIC Take 15 mg by mouth daily.   prednisoLONE acetate 1 % ophthalmic suspension Commonly known as:  PRED FORTE INSTILL 1 DROP INTO RIGHT EYE TWICE A DAY   PROAIR HFA 108 (90 Base) MCG/ACT inhaler Generic drug:   albuterol TAKE 2 PUFFS INTO THE LUNGS EVERY 6 HOURS AS NEEDED FOR WHEEZING OR SHORTNESS OF BREATH   SPIRIVA HANDIHALER 18 MCG inhalation capsule Generic drug:  tiotropium Place into inhaler and inhale.   SYMBICORT 160-4.5 MCG/ACT inhaler Generic drug:  budesonide-formoterol Inhale into the lungs.   tadalafil 5 MG tablet Commonly known as:  CIALIS Take 5 mg by mouth daily as needed for erectile dysfunction.   tamsulosin 0.4 MG Caps capsule Commonly known as:  FLOMAX TAKE 1 CAPSULE (0.4 MG TOTAL) BY MOUTH ONCE DAILY. TAKE 30 MINUTES AFTER SAME MEAL EACH DAY.       Allergies:  Allergies  Allergen Reactions  . Contrast Media [Iodinated Diagnostic Agents] Rash    Family History: Family History  Problem Relation Age of Onset  . Kidney cancer Neg Hx   . Kidney disease Neg Hx   . Prostate cancer Neg Hx   . Bladder Cancer Neg Hx     Social History:  reports that he has quit smoking. He has quit using smokeless tobacco. He reports that he does not drink alcohol or use drugs.  ROS: UROLOGY Frequent Urination?: No Hard to postpone urination?: No Burning/pain with urination?: No Get up at night to urinate?: No Leakage of urine?: No Urine stream starts and stops?: No Trouble starting stream?: No Do you have to strain to urinate?: No Blood in urine?: No Urinary tract infection?: No Sexually transmitted disease?: No Injury to kidneys or bladder?: No Painful intercourse?: No Weak stream?: No Erection problems?: No Penile pain?: No  Gastrointestinal Nausea?: No Vomiting?: No Indigestion/heartburn?: No Diarrhea?: No Constipation?: No  Constitutional Fever: No Night sweats?: No Weight loss?: No Fatigue?: No  Skin Skin rash/lesions?: No Itching?: No  Eyes Blurred vision?: No Double vision?: No  Ears/Nose/Throat Sore throat?: No Sinus problems?: No  Hematologic/Lymphatic Swollen glands?: No Easy bruising?: No  Cardiovascular Leg swelling?: No Chest  pain?: No  Respiratory Cough?: No Shortness of breath?: No  Endocrine Excessive thirst?: No  Musculoskeletal Back pain?: No Joint pain?: No  Neurological Headaches?: No Dizziness?: No  Psychologic Depression?: No Anxiety?: No  Physical Exam: BP 124/70 (BP Location: Right Arm, Patient Position: Sitting, Cuff Size: Normal)   Pulse 60   Ht 6' (1.829 m)   Wt 98.9 kg (218 lb 1.6 oz)   BMI 29.58 kg/m   Constitutional:  Alert and oriented, No acute distress. HEENT: [place] AT, moist mucus membranes.  Trachea midline, no masses. Cardiovascular: No clubbing, cyanosis, or edema. Respiratory: Normal respiratory effort, no increased work of breathing. GI: Abdomen is soft, nontender, nondistended, no abdominal masses GU: No CVA tenderness DRE: 80 g prostate, no hard area or nodule Lymph: No cervical or inguinal lymphadenopathy. Skin: No rashes, bruises or suspicious lesions. Neurologic: Grossly intact, no focal deficits,  moving all 4 extremities. Psychiatric: Normal mood and affect.  Laboratory Data: Lab Results  Component Value Date   WBC 5.8 04/02/2014   HGB 12.9 (L) 04/02/2014   HCT 39.5 (L) 04/02/2014   MCV 91 04/02/2014   PLT 204 04/02/2014    Lab Results  Component Value Date   CREATININE 1.20 04/02/2014    Lab Results  Component Value Date   PSA 8.81 (H) 09/26/2016    No results found for: TESTOSTERONE  No results found for: HGBA1C  Urinalysis No results found for: COLORURINE, APPEARANCEUR, LABSPEC, PHURINE, GLUCOSEU, HGBUR, BILIRUBINUR, KETONESUR, PROTEINUR, UROBILINOGEN, NITRITE, LEUKOCYTESUR  No results found for: LABMICR, [place], RBCUA, LABEPIT, MUCUS, BACTERIA  No results found for this or any previous visit. No results found for this or any previous visit. No results found for this or any previous visit. No results found for this or any previous visit. No results found for this or any previous visit. No results found for this or any previous  visit. No results found for this or any previous visit. No results found for this or any previous visit.  Assessment & Plan:    1) PSA -  Normal dre. I recommended a prostate MRI. This will be arranged and if benign keep 6 mo f/u.   2) ED - he decided not to pursue IPP at this time.  3) BPH - stable  No follow-ups on file.  Festus Aloe, MD  [facility] [address], [place], [place] [address] [phone]

## 2017-12-09 DIAGNOSIS — E538 Deficiency of other specified B group vitamins: Secondary | ICD-10-CM | POA: Diagnosis not present

## 2018-01-08 DIAGNOSIS — E538 Deficiency of other specified B group vitamins: Secondary | ICD-10-CM | POA: Diagnosis not present

## 2018-01-20 ENCOUNTER — Ambulatory Visit
Admission: RE | Admit: 2018-01-20 | Discharge: 2018-01-20 | Disposition: A | Discharge status: Home / Self Care | Payer: BLUE CROSS/BLUE SHIELD | Source: Ambulatory Visit | Attending: Urology | Admitting: Urology

## 2018-01-20 DIAGNOSIS — R972 Elevated prostate specific antigen [PSA]: Secondary | ICD-10-CM

## 2018-01-20 MED ORDER — GADOBENATE DIMEGLUMINE 529 MG/ML IV SOLN
20.0000 mL | Freq: Once | INTRAVENOUS | Status: AC | PRN
  Administered 2018-01-20: 20 mL via INTRAVENOUS

## 2018-01-26 ENCOUNTER — Telehealth: Payer: Self-pay

## 2018-01-26 NOTE — Telephone Encounter (Signed)
-----   Message from Festus Aloe, MD sent at 01/25/2018  9:38 PM EDT ----- Notify patient -- prostate MRI was normal - no sign of prostate cancer. Only showed an enlarged prostate (benign finding - not cancer). He should return in 6 months with a PSA prior to see one of the MD's. Thanks.     ----- Message ----- From: Garnette Gunner, CMA Sent: 01/20/2018   4:46 PM To: Festus Aloe, MD    ----- Message ----- From: Interface, Rad Results In Sent: 01/20/2018   4:36 PM To: Rowe Robert Clinical

## 2018-02-05 ENCOUNTER — Other Ambulatory Visit: Payer: Self-pay

## 2018-02-08 DIAGNOSIS — E538 Deficiency of other specified B group vitamins: Secondary | ICD-10-CM | POA: Diagnosis not present

## 2018-03-10 DIAGNOSIS — Z79899 Other long term (current) drug therapy: Secondary | ICD-10-CM | POA: Diagnosis not present

## 2018-03-10 DIAGNOSIS — Z23 Encounter for immunization: Secondary | ICD-10-CM | POA: Diagnosis not present

## 2018-03-10 DIAGNOSIS — R3912 Poor urinary stream: Secondary | ICD-10-CM | POA: Diagnosis not present

## 2018-03-10 DIAGNOSIS — N401 Enlarged prostate with lower urinary tract symptoms: Secondary | ICD-10-CM | POA: Diagnosis not present

## 2018-03-10 DIAGNOSIS — J449 Chronic obstructive pulmonary disease, unspecified: Secondary | ICD-10-CM | POA: Diagnosis not present

## 2018-03-10 DIAGNOSIS — E669 Obesity, unspecified: Secondary | ICD-10-CM | POA: Diagnosis not present

## 2018-03-10 DIAGNOSIS — E538 Deficiency of other specified B group vitamins: Secondary | ICD-10-CM | POA: Diagnosis not present

## 2018-03-10 DIAGNOSIS — I10 Essential (primary) hypertension: Secondary | ICD-10-CM | POA: Diagnosis not present

## 2018-03-10 DIAGNOSIS — Z0000 Encounter for general adult medical examination without abnormal findings: Secondary | ICD-10-CM | POA: Diagnosis not present

## 2018-03-10 DIAGNOSIS — M4726 Other spondylosis with radiculopathy, lumbar region: Secondary | ICD-10-CM | POA: Diagnosis not present

## 2018-03-10 DIAGNOSIS — E782 Mixed hyperlipidemia: Secondary | ICD-10-CM | POA: Diagnosis not present

## 2018-03-23 DIAGNOSIS — H548 Legal blindness, as defined in USA: Secondary | ICD-10-CM | POA: Diagnosis not present

## 2018-03-23 DIAGNOSIS — H401133 Primary open-angle glaucoma, bilateral, severe stage: Secondary | ICD-10-CM | POA: Diagnosis not present

## 2018-03-23 DIAGNOSIS — Z961 Presence of intraocular lens: Secondary | ICD-10-CM | POA: Diagnosis not present

## 2018-04-09 DIAGNOSIS — E538 Deficiency of other specified B group vitamins: Secondary | ICD-10-CM | POA: Diagnosis not present

## 2018-04-09 DIAGNOSIS — Z23 Encounter for immunization: Secondary | ICD-10-CM | POA: Diagnosis not present

## 2018-05-11 DIAGNOSIS — E538 Deficiency of other specified B group vitamins: Secondary | ICD-10-CM | POA: Diagnosis not present

## 2018-05-31 ENCOUNTER — Other Ambulatory Visit: Payer: PPO

## 2018-05-31 DIAGNOSIS — R972 Elevated prostate specific antigen [PSA]: Secondary | ICD-10-CM

## 2018-06-01 LAB — PSA, TOTAL AND FREE
PSA, Free Pct: 32.8 %
PSA, Free: 2.69 ng/mL
Prostate Specific Ag, Serum: 8.2 ng/mL — ABNORMAL HIGH (ref 0.0–4.0)

## 2018-06-02 NOTE — Progress Notes (Incomplete)
06/03/2018 11:52 AM   Kirk Wright 06-17-1946 [id number]  Referring provider: [name], NP [address] [place] [address] [place], [place] [address]  No chief complaint on file.   HPI: Kirk Wright is a 72 yo M who returns today for management and evaluation of rising PSA, ED, and BPH. The patient's last visit with [place] was on 12/03/2017 with Festus Aloe, MD. The patient reports that *** is doing well overall and ***.   The patient reports ***    A:  1. Rising PSA  *** 2. ED  *** 3. BPH  ***      Component     Latest Ref Rng & Units 09/26/2016 05/25/2017 11/30/2017 05/31/2018  Prostate Specific Ag, Serum     0.0 - 4.0 ng/mL 7.6 (H) 8.0 (H) 9.7 (H) 8.2 (H)   PMH: Past Medical History:  Diagnosis Date   Cataract    COPD (chronic obstructive pulmonary disease) ([facility])    DJD (degenerative joint disease)    Elevated PSA    Erectile dysfunction    Hypertension    Neutropenia ([facility])    Obesity    Obesity    Pneumonia    POAG (primary open-angle glaucoma)    Pseudophakia of both eyes    Sickle cell trait ([place])    Sleep apnea    Vitamin B 12 deficiency     Surgical History: Past Surgical History:  Procedure Laterality Date   COLONOSCOPY WITH PROPOFOL N/A 02/23/2015   Procedure: COLONOSCOPY WITH PROPOFOL;  Surgeon: [name], MD;  Location: [facility] ENDOSCOPY;  Service: Endoscopy;  Laterality: N/A;   EYE SURGERY     GLAUCOMA SURGERY     HERNIA REPAIR     PROSTRATE BX      Home Medications:  Allergies as of 06/03/2018      Reactions   Contrast Media [iodinated Diagnostic Agents] Rash      Medication List        Accurate as of 06/02/18 11:52 AM. Always use your most recent med list.          acetaZOLAMIDE 250 MG tablet Commonly known as:  DIAMOX Take 250 mg by mouth 3 (three) times daily.   aspirin 81 MG tablet Take 81 mg by mouth daily.   bimatoprost 0.03 % ophthalmic solution Commonly known as:  LUMIGAN 1  drop at bedtime.   brimonidine-timolol 0.2-0.5 % ophthalmic solution Commonly known as:  COMBIGAN Place 1 drop into both eyes every 12 (twelve) hours.   brinzolamide 1 % ophthalmic suspension Commonly known as:  AZOPT 1 drop 3 (three) times daily.   ferrous sulfate 325 (65 FE) MG tablet Take 325 mg by mouth daily with breakfast.   gabapentin 100 MG capsule Commonly known as:  NEURONTIN Take 100 mg by mouth 3 (three) times daily.   ibuprofen 200 MG tablet Commonly known as:  ADVIL,MOTRIN Take 200 mg by mouth every 6 (six) hours as needed.   lisinopril-hydrochlorothiazide 10-12.5 MG tablet Commonly known as:  PRINZIDE,ZESTORETIC Take 1 tablet by mouth daily.   meloxicam 15 MG tablet Commonly known as:  MOBIC Take 15 mg by mouth daily.   prednisoLONE acetate 1 % ophthalmic suspension Commonly known as:  PRED FORTE INSTILL 1 DROP INTO RIGHT EYE TWICE A DAY   PROAIR HFA 108 (90 Base) MCG/ACT inhaler Generic drug:  albuterol TAKE 2 PUFFS INTO THE LUNGS EVERY 6 HOURS AS NEEDED FOR WHEEZING OR SHORTNESS OF BREATH   SPIRIVA HANDIHALER 18 MCG  inhalation capsule Generic drug:  tiotropium Place into inhaler and inhale.   SYMBICORT 160-4.5 MCG/ACT inhaler Generic drug:  budesonide-formoterol Inhale into the lungs.   tadalafil 5 MG tablet Commonly known as:  CIALIS Take 5 mg by mouth daily as needed for erectile dysfunction.   tamsulosin 0.4 MG Caps capsule Commonly known as:  FLOMAX TAKE 1 CAPSULE (0.4 MG TOTAL) BY MOUTH ONCE DAILY. TAKE 30 MINUTES AFTER SAME MEAL EACH DAY.       Allergies:  Allergies  Allergen Reactions   Contrast Media [Iodinated Diagnostic Agents] Rash    Family History: Family History  Problem Relation Age of Onset   Kidney cancer Neg Hx    Kidney disease Neg Hx    Prostate cancer Neg Hx    Bladder Cancer Neg Hx     Social History:  reports that he has quit smoking. He has quit using smokeless tobacco. He reports that he does not  drink alcohol or use drugs.  ROS:                                        Physical Exam: There were no vitals taken for this visit.  Constitutional:  Alert and oriented, No acute distress. HEENT: [place] AT, moist mucus membranes.  Trachea midline, no masses. Cardiovascular: No clubbing, cyanosis, or edema. Respiratory: Normal respiratory effort, no increased work of breathing. GI: Abdomen is soft, nontender, nondistended, no abdominal masses GU: No CVA tenderness Lymph: No cervical or inguinal lymphadenopathy. Skin: No rashes, bruises or suspicious lesions. Neurologic: Grossly intact, no focal deficits, moving all 4 extremities. Psychiatric: Normal mood and affect.  Laboratory Data:   Urinalysis  Pertinent Imaging:  CLINICAL DATA:  Elevated PSA, 11/24/2016  EXAM: MR PROSTATE WITHOUT AND WITH CONTRAST  TECHNIQUE: Multiplanar multisequence MRI images were obtained of the pelvis centered about the prostate. Pre and post contrast images were obtained.  CONTRAST:  70mL MULTIHANCE GADOBENATE DIMEGLUMINE 529 MG/ML IV SOLN  Creatinine was obtained on site at [facility] at [address]  Results: Creatinine 1.5 mg/dL.  COMPARISON:  None  FINDINGS: Prostate: On T2 weighted imaging (series 7) the peripheral zone is thinned by the enlarged transitional zone. There is heterogeneous signal intensity in the peripheral zone but no focal lesion identified. There is no evidence restricted diffusion within the peripheral zone.  The transitional zone is enlarged well capsulated nodules of varying size. These nodules extend into base the bladder. No suspicious lesion identified.  The prostatic capsule is intact.  Seminal vesicles are normal.  Volume: 6.5 x 5.2 by 7.3 cm (volume = 130 cm^3)  Transcapsular spread:  Absent  Seminal vesicle involvement: Absent  Neurovascular bundle involvement: Absent  Pelvic adenopathy:  Absent  Bone metastasis: Absent  Other findings: Discogenic sclerosis in the LEFT aspect of the L5-S1 vertebral bodies (image 7/3).  IMPRESSION: 1. No evidence of high-grade carcinoma within the peripheral zone (PI-RADS 1). 2. Markedly enlarged transitional zone most consistent benign prostate hypertrophy (PI-RADS 1).   Electronically Signed   By: Suzy Bouchard M.D.   On: 01/20/2018 16:34  I have reviewed the MRI personally and with pt.   Assessment & Plan:    @DIAGMED @  No follow-ups on file.  [facility] [address], [place],  [address] [phone]  I, Lucas Mallow, am acting as a scribe for Dr. Caryl Pina  Erlene Quan,  {Add Barista Statement}

## 2018-06-03 ENCOUNTER — Ambulatory Visit: Payer: PPO

## 2018-06-03 ENCOUNTER — Ambulatory Visit: Payer: PPO | Admitting: Urology

## 2018-06-11 ENCOUNTER — Encounter: Payer: Self-pay | Admitting: Urology

## 2018-06-11 ENCOUNTER — Ambulatory Visit (INDEPENDENT_AMBULATORY_CARE_PROVIDER_SITE_OTHER): Payer: PPO | Admitting: Urology

## 2018-06-11 VITALS — BP 135/67 | HR 61 | Ht 72.0 in | Wt 220.0 lb

## 2018-06-11 DIAGNOSIS — C61 Malignant neoplasm of prostate: Secondary | ICD-10-CM | POA: Diagnosis not present

## 2018-06-11 DIAGNOSIS — N5201 Erectile dysfunction due to arterial insufficiency: Secondary | ICD-10-CM

## 2018-06-11 DIAGNOSIS — N138 Other obstructive and reflux uropathy: Secondary | ICD-10-CM

## 2018-06-11 DIAGNOSIS — L293 Anogenital pruritus, unspecified: Secondary | ICD-10-CM

## 2018-06-11 DIAGNOSIS — N401 Enlarged prostate with lower urinary tract symptoms: Secondary | ICD-10-CM | POA: Diagnosis not present

## 2018-06-11 DIAGNOSIS — N433 Hydrocele, unspecified: Secondary | ICD-10-CM

## 2018-06-11 NOTE — Progress Notes (Signed)
06/11/2018 4:26 PM   Kirk Wright 07-Jan-1946 [id number]  Referring provider: [name], NP [address] [place],  [address]  Chief Complaint  Patient presents with  . Prostate Cancer    6 month    HPI: 72 year old male who returns today for routine follow-up for his low risk prostate cancer.    He also mentions today that he is had a chronic itching of the shaft of his penis on the ventral aspect for the past several years.  This primarily in the morning.  It goes away spontaneously.  It is not exacerbated or alleviated by anything.  He also has a right-sided hydrocele which is been present since his hernia surgery.  This not particular bothersome to him.  Very low risk prostate cancer Patient underwent a prostate biopsy on 11/14/2012 with Dr. [name] for a PSA of 6.86. 58 gram prostate. Results were Gleason 3+3 cancer in 2/12 cores. 2% core of lateral mid and 8% of lateral apex. He was then referred to Dr. [name] on 01/25/2013 for possible robotic prostatectomy.  He elected to defer this.  He had a repeat prostate biopsy in May 2018 which was negative for disease. Patient chose to manage his prostate cancer with active surveillance.    Most recent imaging in the form of prostate MRI completed in 12/2017 showed no suspicious or high-grade lesions, PI-RADS 1.  His most recent PSA was 8.2 on 05/31/2018.  This is down from his previous PSA 6 months ago at 9.7.  Rectal exam performed by Dr. Junious Silk 6 months ago.  Erectile dysfunction Severe erectile dysfunction managed with Trimix.  He also uses a penile ring to help the erection last longer.  He is considering a new device that he saw online.  He briefly considered implant but has elected to continue pharmacological management.  BPH with obstruction He does also have a personal history of BPH.  Prostate size estimated to be 78 nonoccluding median lobe, 135 including median lobe.  He has been on Flomax  which is helped significantly with his urinary symptoms.  He is pleased with his voiding symptoms today.  He feels like he has a good stream and able to empty his bladder completely.  PMH: Past Medical History:  Diagnosis Date  . Cataract   . COPD (chronic obstructive pulmonary disease) ([place])   . DJD (degenerative joint disease)   . Elevated PSA   . Erectile dysfunction   . Hypertension   . Neutropenia ([place])   . Obesity   . Obesity   . Pneumonia   . POAG (primary open-angle glaucoma)   . Pseudophakia of both eyes   . Sickle cell trait ([place])   . Sleep apnea   . Vitamin B 12 deficiency     Surgical History: Past Surgical History:  Procedure Laterality Date  . COLONOSCOPY WITH PROPOFOL N/A 02/23/2015   Procedure: COLONOSCOPY WITH PROPOFOL;  Surgeon: [name], MD;  Location: [facility] ENDOSCOPY;  Service: Endoscopy;  Laterality: N/A;  . EYE SURGERY    . GLAUCOMA SURGERY    . HERNIA REPAIR    . PROSTRATE BX      Home Medications:  Allergies as of 06/11/2018      Reactions   Contrast Media [iodinated Diagnostic Agents] Rash      Medication List       Accurate as of June 11, 2018  4:26 PM. Always use your most recent med list.  acetaZOLAMIDE 250 MG tablet Commonly known as:  DIAMOX Take 250 mg by mouth 3 (three) times daily.   aspirin 81 MG tablet Take 81 mg by mouth daily.   bimatoprost 0.03 % ophthalmic solution Commonly known as:  LUMIGAN 1 drop at bedtime.   brimonidine-timolol 0.2-0.5 % ophthalmic solution Commonly known as:  COMBIGAN Place 1 drop into both eyes every 12 (twelve) hours.   brinzolamide 1 % ophthalmic suspension Commonly known as:  AZOPT 1 drop 3 (three) times daily.   ferrous sulfate 325 (65 FE) MG tablet Take 325 mg by mouth daily with breakfast.   gabapentin 100 MG capsule Commonly known as:  NEURONTIN Take 100 mg by mouth 3 (three) times daily.   ibuprofen 200 MG tablet Commonly known as:  ADVIL,MOTRIN Take 200 mg  by mouth every 6 (six) hours as needed.   lisinopril-hydrochlorothiazide 10-12.5 MG tablet Commonly known as:  PRINZIDE,ZESTORETIC Take 1 tablet by mouth daily.   meloxicam 15 MG tablet Commonly known as:  MOBIC Take 15 mg by mouth daily.   prednisoLONE acetate 1 % ophthalmic suspension Commonly known as:  PRED FORTE INSTILL 1 DROP INTO RIGHT EYE TWICE A DAY   PROAIR HFA 108 (90 Base) MCG/ACT inhaler Generic drug:  albuterol TAKE 2 PUFFS INTO THE LUNGS EVERY 6 HOURS AS NEEDED FOR WHEEZING OR SHORTNESS OF BREATH   SPIRIVA HANDIHALER 18 MCG inhalation capsule Generic drug:  tiotropium Place into inhaler and inhale.   SYMBICORT 160-4.5 MCG/ACT inhaler Generic drug:  budesonide-formoterol Inhale into the lungs.   tamsulosin 0.4 MG Caps capsule Commonly known as:  FLOMAX TAKE 1 CAPSULE (0.4 MG TOTAL) BY MOUTH ONCE DAILY. TAKE 30 MINUTES AFTER SAME MEAL EACH DAY.       Allergies:  Allergies  Allergen Reactions  . Contrast Media [Iodinated Diagnostic Agents] Rash    Family History: Family History  Problem Relation Age of Onset  . Kidney cancer Neg Hx   . Kidney disease Neg Hx   . Prostate cancer Neg Hx   . Bladder Cancer Neg Hx     Social History:  reports that he has quit smoking. He has quit using smokeless tobacco. He reports that he does not drink alcohol or use drugs.  ROS: UROLOGY Frequent Urination?: No Hard to postpone urination?: No Burning/pain with urination?: No Get up at night to urinate?: No Leakage of urine?: No Urine stream starts and stops?: No Trouble starting stream?: No Do you have to strain to urinate?: No Blood in urine?: No Urinary tract infection?: No Sexually transmitted disease?: No Injury to kidneys or bladder?: No Painful intercourse?: No Weak stream?: No Erection problems?: No Penile pain?: No  Gastrointestinal Nausea?: No Vomiting?: No Indigestion/heartburn?: No Diarrhea?: No Constipation?: No  Constitutional Fever:  No Night sweats?: No Weight loss?: No Fatigue?: No  Skin Skin rash/lesions?: No Itching?: No  Eyes Blurred vision?: No Double vision?: No  Ears/Nose/Throat Sore throat?: No Sinus problems?: No  Hematologic/Lymphatic Swollen glands?: No Easy bruising?: No  Cardiovascular Leg swelling?: No Chest pain?: No  Respiratory Cough?: No Shortness of breath?: No  Endocrine Excessive thirst?: No  Musculoskeletal Back pain?: No Joint pain?: No  Neurological Headaches?: No Dizziness?: No  Psychologic Depression?: No Anxiety?: No  Physical Exam: BP 135/67   Pulse 61   Ht 6' (1.829 m)   Wt 220 lb (99.8 kg)   BMI 29.84 kg/m   Constitutional:  Alert and oriented, No acute distress. HEENT: [place] AT, moist mucus membranes.  Trachea  midline, no masses. Cardiovascular: No clubbing, cyanosis, or edema. Respiratory: Normal respiratory effort, no increased work of breathing. Size phallus with easily retractable foreskin.  He points to area on the mid ventral shaft as a source of his penile itching but I see no lesions, masses, or skin changes at this location.  Softball sized right-sided hydrocele.  Left testicle normal. Skin: No rashes, bruises or suspicious lesions. Neurologic: Grossly intact, no focal deficits, moving all 4 extremities. Psychiatric: Normal mood and affect.  Laboratory Data: Lab Results  Component Value Date   WBC 5.8 04/02/2014   HGB 12.9 (L) 04/02/2014   HCT 39.5 (L) 04/02/2014   MCV 91 04/02/2014   PLT 204 04/02/2014    Lab Results  Component Value Date   CREATININE 1.20 04/02/2014    Lab Results  Component Value Date   PSA 8.81 (H) 09/26/2016     Assessment & Plan:    1. Prostate cancer ([place]) PSA stable Continue to follow q6 months with PSA DRE next visit - PSA; Future  2. BPH with urinary obstruction Continue Flomax  3. Erectile dysfunction due to arterial insufficiency Not interested in IPP Continue Trimix and penile  ring  4. Right hydrocele Softball sized right hydrocele Minimally symptomatic Discussed surgical options, not currently interested  5. Penile itching Unclear etiology Recommend moisturizer No obvious skin lesions  Return in about 6 months (around 12/11/2018) for PSA.  Hollice Espy, MD  [facility] [address] [place], [place] [address] [phone]

## 2018-07-02 DIAGNOSIS — E538 Deficiency of other specified B group vitamins: Secondary | ICD-10-CM | POA: Diagnosis not present

## 2018-08-17 DIAGNOSIS — J441 Chronic obstructive pulmonary disease with (acute) exacerbation: Secondary | ICD-10-CM | POA: Diagnosis not present

## 2018-08-17 DIAGNOSIS — J00 Acute nasopharyngitis [common cold]: Secondary | ICD-10-CM | POA: Diagnosis not present

## 2018-09-08 DIAGNOSIS — R3912 Poor urinary stream: Secondary | ICD-10-CM | POA: Diagnosis not present

## 2018-09-08 DIAGNOSIS — E669 Obesity, unspecified: Secondary | ICD-10-CM | POA: Diagnosis not present

## 2018-09-08 DIAGNOSIS — N401 Enlarged prostate with lower urinary tract symptoms: Secondary | ICD-10-CM | POA: Diagnosis not present

## 2018-09-08 DIAGNOSIS — C61 Malignant neoplasm of prostate: Secondary | ICD-10-CM | POA: Diagnosis not present

## 2018-09-08 DIAGNOSIS — J449 Chronic obstructive pulmonary disease, unspecified: Secondary | ICD-10-CM | POA: Diagnosis not present

## 2018-09-08 DIAGNOSIS — I10 Essential (primary) hypertension: Secondary | ICD-10-CM | POA: Diagnosis not present

## 2018-09-08 DIAGNOSIS — E782 Mixed hyperlipidemia: Secondary | ICD-10-CM | POA: Diagnosis not present

## 2018-09-08 DIAGNOSIS — E538 Deficiency of other specified B group vitamins: Secondary | ICD-10-CM | POA: Diagnosis not present

## 2018-09-08 DIAGNOSIS — Z79899 Other long term (current) drug therapy: Secondary | ICD-10-CM | POA: Diagnosis not present

## 2018-12-15 DIAGNOSIS — R350 Frequency of micturition: Secondary | ICD-10-CM | POA: Diagnosis not present

## 2018-12-15 DIAGNOSIS — N451 Epididymitis: Secondary | ICD-10-CM | POA: Diagnosis not present

## 2018-12-15 DIAGNOSIS — L239 Allergic contact dermatitis, unspecified cause: Secondary | ICD-10-CM | POA: Diagnosis not present

## 2018-12-15 DIAGNOSIS — R8281 Pyuria: Secondary | ICD-10-CM | POA: Diagnosis not present

## 2018-12-15 DIAGNOSIS — R8271 Bacteriuria: Secondary | ICD-10-CM | POA: Diagnosis not present

## 2018-12-17 ENCOUNTER — Ambulatory Visit: Payer: PPO | Admitting: Urology

## 2019-01-07 ENCOUNTER — Other Ambulatory Visit: Payer: Self-pay

## 2019-01-07 ENCOUNTER — Encounter: Payer: Self-pay | Admitting: Urology

## 2019-01-07 ENCOUNTER — Ambulatory Visit (INDEPENDENT_AMBULATORY_CARE_PROVIDER_SITE_OTHER): Payer: PPO | Admitting: Urology

## 2019-01-07 ENCOUNTER — Other Ambulatory Visit
Admission: RE | Admit: 2019-01-07 | Discharge: 2019-01-07 | Disposition: A | Discharge status: Home / Self Care | Payer: PPO | Attending: Urology | Admitting: Urology

## 2019-01-07 VITALS — BP 118/63 | HR 85 | Ht 72.0 in | Wt 220.0 lb

## 2019-01-07 DIAGNOSIS — N433 Hydrocele, unspecified: Secondary | ICD-10-CM

## 2019-01-07 DIAGNOSIS — H548 Legal blindness, as defined in USA: Secondary | ICD-10-CM | POA: Diagnosis not present

## 2019-01-07 DIAGNOSIS — N529 Male erectile dysfunction, unspecified: Secondary | ICD-10-CM

## 2019-01-07 DIAGNOSIS — C61 Malignant neoplasm of prostate: Secondary | ICD-10-CM | POA: Diagnosis not present

## 2019-01-07 DIAGNOSIS — N401 Enlarged prostate with lower urinary tract symptoms: Secondary | ICD-10-CM | POA: Diagnosis not present

## 2019-01-07 DIAGNOSIS — N138 Other obstructive and reflux uropathy: Secondary | ICD-10-CM | POA: Diagnosis not present

## 2019-01-07 DIAGNOSIS — Z961 Presence of intraocular lens: Secondary | ICD-10-CM | POA: Diagnosis not present

## 2019-01-07 DIAGNOSIS — H401133 Primary open-angle glaucoma, bilateral, severe stage: Secondary | ICD-10-CM | POA: Diagnosis not present

## 2019-01-07 LAB — PSA: Prostatic Specific Antigen: 12.92 ng/mL — ABNORMAL HIGH (ref 0.00–4.00)

## 2019-01-07 NOTE — Progress Notes (Signed)
01/07/2019 11:38 AM   Kirk Wright Mar 22, 1946 [id number]  Referring provider: [name], NP [address] [place],  [place] [address]  Chief Complaint  Patient presents with  . Prostate Cancer    HPI: 73 year old male who returns today for routine follow-up for his low risk prostate cancer.    In the interim, patient was seen for suspected left epididymitis and urinary tract infection at [facility] clinic on 12/15/2018.  He was treated with Cipro 500 mg twice daily x10 days.  Urinalysis at that visit was positive for nitrites, leukocyte esterase, >50 white blood cells, and 10-50 red blood cells. Urine culture revealed >100,000 CFU's E. coli that was sensitive to Cipro.  Patient completed his antibiotic course and reports no symptoms today.  Very low risk prostate cancer Patient underwent a prostate biopsy on 11/14/2012 with Dr. [name] for a PSA of 6.86. 58 gram prostate. Results were Gleason 3+3 cancer in 2/12 cores. 2% core of lateral mid and 8% of lateral apex. He was then referred to Dr. [name] on 01/25/2013 for possible robotic prostatectomy.  He elected to defer this.  He had a repeat prostate biopsy in May 2018 which was negative for disease. Patient chose to manage his prostate cancer with active surveillance.    Most recent imaging in the form of prostate MRI completed in 12/2017 showed no suspicious or high-grade lesions, PI-RADS 1.  His most recent PSA was 8.2 on 05/31/2018.   His PSA today is pending.  Erectile dysfunction Severe erectile dysfunction managed with Trimix.  He also uses a penile ring to help the erection last longer.  He is considering a new device that he saw online.  He briefly considered implant but has elected to continue pharmacological management. Patient reports he has not used Trimix in a year and a half, but he remembers how to use it.  He is not currently sexually active.  BPH with obstruction He does also have a personal  history of BPH.  Prostate size estimated to be 78 nonincluding median lobe, 135 including median lobe.  He has been on Flomax which is helped significantly with his urinary symptoms.  He is pleased with his voiding symptoms today.  He feels like he has a good stream and able to empty his bladder completely.    PMH: Past Medical History:  Diagnosis Date  . Cataract   . COPD (chronic obstructive pulmonary disease) ([place])   . DJD (degenerative joint disease)   . Elevated PSA   . Erectile dysfunction   . Hypertension   . Neutropenia ([place])   . Obesity   . Obesity   . Pneumonia   . POAG (primary open-angle glaucoma)   . Pseudophakia of both eyes   . Sickle cell trait ([place])   . Sleep apnea   . Vitamin B 12 deficiency     Surgical History: Past Surgical History:  Procedure Laterality Date  . COLONOSCOPY WITH PROPOFOL N/A 02/23/2015   Procedure: COLONOSCOPY WITH PROPOFOL;  Surgeon: [name], MD;  Location[facility] ENDOSCOPY;  Service: Endoscopy;  Laterality: N/A;  . EYE SURGERY    . GLAUCOMA SURGERY    . HERNIA REPAIR    . PROSTRATE BX      Home Medications:  Allergies as of 01/07/2019      Reactions   Contrast Media [iodinated Diagnostic Agents] Rash      Medication List       Accurate as of January 07, 2019 11:59  PM. If you have any questions, ask your nurse or doctor.        STOP taking these medications   Symbicort 160-4.5 MCG/ACT inhaler Generic drug: budesonide-formoterol Stopped by: Hollice Espy, MD     TAKE these medications   acetaZOLAMIDE 250 MG tablet Commonly known as: DIAMOX Take 250 mg by mouth 3 (three) times daily.   aspirin 81 MG tablet Take 81 mg by mouth daily.   bimatoprost 0.03 % ophthalmic solution Commonly known as: LUMIGAN 1 drop at bedtime.   brimonidine-timolol 0.2-0.5 % ophthalmic solution Commonly known as: COMBIGAN Place 1 drop into both eyes every 12 (twelve) hours.   brinzolamide 1 % ophthalmic suspension Commonly known as:  AZOPT 1 drop 3 (three) times daily.   ferrous sulfate 325 (65 FE) MG tablet Take 325 mg by mouth daily with breakfast.   gabapentin 100 MG capsule Commonly known as: NEURONTIN Take 100 mg by mouth 3 (three) times daily.   ibuprofen 200 MG tablet Commonly known as: ADVIL Take 200 mg by mouth every 6 (six) hours as needed.   lisinopril-hydrochlorothiazide 10-12.5 MG tablet Commonly known as: ZESTORETIC Take 1 tablet by mouth daily.   meloxicam 15 MG tablet Commonly known as: MOBIC Take 15 mg by mouth daily.   prednisoLONE acetate 1 % ophthalmic suspension Commonly known as: PRED FORTE INSTILL 1 DROP INTO RIGHT EYE TWICE A DAY   ProAir HFA 108 (90 Base) MCG/ACT inhaler Generic drug: albuterol TAKE 2 PUFFS INTO THE LUNGS EVERY 6 HOURS AS NEEDED FOR WHEEZING OR SHORTNESS OF BREATH   Spiriva HandiHaler 18 MCG inhalation capsule Generic drug: tiotropium Place into inhaler and inhale.   tamsulosin 0.4 MG Caps capsule Commonly known as: FLOMAX TAKE 1 CAPSULE (0.4 MG TOTAL) BY MOUTH ONCE DAILY. TAKE 30 MINUTES AFTER SAME MEAL EACH DAY.       Allergies:  Allergies  Allergen Reactions  . Contrast Media [Iodinated Diagnostic Agents] Rash    Family History: Family History  Problem Relation Age of Onset  . Kidney cancer Neg Hx   . Kidney disease Neg Hx   . Prostate cancer Neg Hx   . Bladder Cancer Neg Hx     Social History:  reports that he has quit smoking. He has quit using smokeless tobacco. He reports that he does not drink alcohol or use drugs.  ROS: UROLOGY Frequent Urination?: No Hard to postpone urination?: No Burning/pain with urination?: No Get up at night to urinate?: No Leakage of urine?: No Urine stream starts and stops?: No Trouble starting stream?: No Do you have to strain to urinate?: No Blood in urine?: No Sexually transmitted disease?: No Injury to kidneys or bladder?: No Painful intercourse?: No Weak stream?: No Erection problems?: No  Penile pain?: No  Gastrointestinal Nausea?: No Vomiting?: No Diarrhea?: No Constipation?: No  Constitutional Fever: No Night sweats?: No Weight loss?: No Fatigue?: No  Skin Skin rash/lesions?: Yes Itching?: Yes  Eyes Blurred vision?: No Double vision?: No  Ears/Nose/Throat Sore throat?: No Sinus problems?: No  Hematologic/Lymphatic Swollen glands?: No Easy bruising?: No  Cardiovascular Chest pain?: No  Respiratory Cough?: No Shortness of breath?: No  Endocrine Excessive thirst?: No  Musculoskeletal Back pain?: No Joint pain?: No  Neurological Headaches?: No Dizziness?: No  Psychologic Depression?: No Anxiety?: No  Physical Exam: BP 118/63   Pulse 85   Ht 6' (1.829 m)   Wt 220 lb (99.8 kg)   BMI 29.84 kg/m   Constitutional:  Alert and oriented, No  acute distress. HEENT: [place] AT, moist mucus membranes.  Trachea midline, no masses. Cardiovascular: No clubbing, cyanosis, or edema. Respiratory: Normal respiratory effort, no increased work of breathing. GU: Softball sized right-sided hydrocele, nontender. DRE: Approximately 80g prostate with vertical ridge of induration just left of midline along the median sulcus. Nontender prostate, normal sphincter tone. Skin: No rashes, bruises or suspicious lesions. Neurologic: Grossly intact, no focal deficits, moving all 4 extremities. Psychiatric: Normal mood and affect.  Laboratory Data: PSA history: 05/31/2018: 8.2 11/30/2017: 9.7 05/25/2017: 8.0 09/26/2016: 7.6  Assessment & Plan:   1. Prostate cancer Patient is due for his annual PSA draw today; lab ordered.  Ridge of induration along the median sulcus discovered on digital rectal exam today, will await PSA results for clinical correlation.  If PSA is elevated from , will order prostatic MRI.  Patient denies LUTS at this time.  Will call the patient with results. -33-month PSA today -Possible prostate MRI if PSA is elevated from 2019 -Return in 6 months  for surveillance  2.  Erectile dysfunction Patient reports not having used Trimix and 1-1/2 years but states that he does recall how to use it.  Offered him reteaching today if needed, patient declined at this time.  3.  Right hydrocele Patient states hydrocele is asymptomatic and does not bother him.  No need to pursue intervention unless he becomes symptomatic.  4.  BPH with urinary obstruction Massive prostate with median lobe Symptoms fairly well controlled on Flomax, will continue his medication  Hollice Espy, MD  [facility] [address], [place], [place] [address] [phone]

## 2019-01-13 ENCOUNTER — Telehealth: Payer: Self-pay | Admitting: Urology

## 2019-01-13 DIAGNOSIS — R972 Elevated prostate specific antigen [PSA]: Secondary | ICD-10-CM

## 2019-01-13 DIAGNOSIS — C61 Malignant neoplasm of prostate: Secondary | ICD-10-CM

## 2019-01-13 NOTE — Telephone Encounter (Signed)
Informed patient-verbalized understanding. Aware he will get a call to get Prostate MRI setup.

## 2019-01-13 NOTE — Telephone Encounter (Signed)
Please let this patient know that his PSA is up from last year, around 12.  I recommended that he repeat his prostate MRI to compare to his MRI from last year.  We discussed this in clinic if his PSA was rising.  Order placed, advised patient I will call him with the results once performed.  Hollice Espy, MD

## 2019-01-27 ENCOUNTER — Other Ambulatory Visit: Payer: Self-pay

## 2019-01-27 ENCOUNTER — Ambulatory Visit
Admission: RE | Admit: 2019-01-27 | Discharge: 2019-01-27 | Disposition: A | Discharge status: Home / Self Care | Payer: PPO | Source: Ambulatory Visit | Attending: Urology | Admitting: Urology

## 2019-01-27 DIAGNOSIS — C61 Malignant neoplasm of prostate: Secondary | ICD-10-CM | POA: Insufficient documentation

## 2019-01-27 DIAGNOSIS — R972 Elevated prostate specific antigen [PSA]: Secondary | ICD-10-CM | POA: Diagnosis not present

## 2019-01-27 LAB — POCT I-STAT CREATININE: Creatinine, Ser: 1.5 mg/dL — ABNORMAL HIGH (ref 0.61–1.24)

## 2019-01-27 MED ORDER — GADOBUTROL 1 MMOL/ML IV SOLN
9.0000 mL | Freq: Once | INTRAVENOUS | Status: AC | PRN
  Administered 2019-01-27: 9 mL via INTRAVENOUS

## 2019-01-31 DIAGNOSIS — H548 Legal blindness, as defined in USA: Secondary | ICD-10-CM | POA: Diagnosis not present

## 2019-01-31 DIAGNOSIS — Z20828 Contact with and (suspected) exposure to other viral communicable diseases: Secondary | ICD-10-CM | POA: Diagnosis not present

## 2019-01-31 DIAGNOSIS — H401133 Primary open-angle glaucoma, bilateral, severe stage: Secondary | ICD-10-CM | POA: Diagnosis not present

## 2019-02-03 DIAGNOSIS — Z8249 Family history of ischemic heart disease and other diseases of the circulatory system: Secondary | ICD-10-CM | POA: Diagnosis not present

## 2019-02-03 DIAGNOSIS — G4730 Sleep apnea, unspecified: Secondary | ICD-10-CM | POA: Diagnosis not present

## 2019-02-03 DIAGNOSIS — J449 Chronic obstructive pulmonary disease, unspecified: Secondary | ICD-10-CM | POA: Diagnosis not present

## 2019-02-03 DIAGNOSIS — E785 Hyperlipidemia, unspecified: Secondary | ICD-10-CM | POA: Diagnosis not present

## 2019-02-03 DIAGNOSIS — I10 Essential (primary) hypertension: Secondary | ICD-10-CM | POA: Diagnosis not present

## 2019-02-03 DIAGNOSIS — R972 Elevated prostate specific antigen [PSA]: Secondary | ICD-10-CM | POA: Diagnosis not present

## 2019-02-03 DIAGNOSIS — H401113 Primary open-angle glaucoma, right eye, severe stage: Secondary | ICD-10-CM | POA: Diagnosis not present

## 2019-02-03 DIAGNOSIS — D573 Sickle-cell trait: Secondary | ICD-10-CM | POA: Diagnosis not present

## 2019-02-03 DIAGNOSIS — Z87891 Personal history of nicotine dependence: Secondary | ICD-10-CM | POA: Diagnosis not present

## 2019-03-23 DIAGNOSIS — R3912 Poor urinary stream: Secondary | ICD-10-CM | POA: Diagnosis not present

## 2019-03-23 DIAGNOSIS — Z0000 Encounter for general adult medical examination without abnormal findings: Secondary | ICD-10-CM | POA: Diagnosis not present

## 2019-03-23 DIAGNOSIS — Z136 Encounter for screening for cardiovascular disorders: Secondary | ICD-10-CM | POA: Diagnosis not present

## 2019-03-23 DIAGNOSIS — J449 Chronic obstructive pulmonary disease, unspecified: Secondary | ICD-10-CM | POA: Diagnosis not present

## 2019-03-23 DIAGNOSIS — E538 Deficiency of other specified B group vitamins: Secondary | ICD-10-CM | POA: Diagnosis not present

## 2019-03-23 DIAGNOSIS — E782 Mixed hyperlipidemia: Secondary | ICD-10-CM | POA: Diagnosis not present

## 2019-03-23 DIAGNOSIS — N401 Enlarged prostate with lower urinary tract symptoms: Secondary | ICD-10-CM | POA: Diagnosis not present

## 2019-03-23 DIAGNOSIS — E669 Obesity, unspecified: Secondary | ICD-10-CM | POA: Diagnosis not present

## 2019-03-23 DIAGNOSIS — Z79899 Other long term (current) drug therapy: Secondary | ICD-10-CM | POA: Diagnosis not present

## 2019-03-23 DIAGNOSIS — I10 Essential (primary) hypertension: Secondary | ICD-10-CM | POA: Diagnosis not present

## 2019-03-23 DIAGNOSIS — Z23 Encounter for immunization: Secondary | ICD-10-CM | POA: Diagnosis not present

## 2019-04-07 DIAGNOSIS — E782 Mixed hyperlipidemia: Secondary | ICD-10-CM | POA: Diagnosis not present

## 2019-04-07 DIAGNOSIS — Z87891 Personal history of nicotine dependence: Secondary | ICD-10-CM | POA: Diagnosis not present

## 2019-04-07 DIAGNOSIS — I10 Essential (primary) hypertension: Secondary | ICD-10-CM | POA: Diagnosis not present

## 2019-04-07 DIAGNOSIS — Z136 Encounter for screening for cardiovascular disorders: Secondary | ICD-10-CM | POA: Diagnosis not present

## 2019-06-01 DIAGNOSIS — J069 Acute upper respiratory infection, unspecified: Secondary | ICD-10-CM | POA: Diagnosis not present

## 2019-06-07 DIAGNOSIS — H401133 Primary open-angle glaucoma, bilateral, severe stage: Secondary | ICD-10-CM | POA: Diagnosis not present

## 2019-07-15 ENCOUNTER — Ambulatory Visit: Payer: PPO | Admitting: Urology

## 2019-07-20 ENCOUNTER — Other Ambulatory Visit: Payer: Self-pay

## 2019-07-20 DIAGNOSIS — C61 Malignant neoplasm of prostate: Secondary | ICD-10-CM

## 2019-07-22 ENCOUNTER — Other Ambulatory Visit
Admission: RE | Admit: 2019-07-22 | Discharge: 2019-07-22 | Disposition: A | Discharge status: Home / Self Care | Payer: PPO | Attending: Urology | Admitting: Urology

## 2019-07-22 ENCOUNTER — Other Ambulatory Visit: Payer: Self-pay

## 2019-07-22 DIAGNOSIS — C61 Malignant neoplasm of prostate: Secondary | ICD-10-CM

## 2019-07-23 LAB — PSA: Prostatic Specific Antigen: 9.84 ng/mL — ABNORMAL HIGH (ref 0.00–4.00)

## 2019-07-29 ENCOUNTER — Encounter: Payer: Self-pay | Admitting: Urology

## 2019-07-29 ENCOUNTER — Ambulatory Visit (INDEPENDENT_AMBULATORY_CARE_PROVIDER_SITE_OTHER): Payer: PPO | Admitting: Urology

## 2019-07-29 ENCOUNTER — Other Ambulatory Visit: Payer: Self-pay

## 2019-07-29 VITALS — BP 131/68 | HR 72 | Ht 72.0 in | Wt 225.0 lb

## 2019-07-29 DIAGNOSIS — N138 Other obstructive and reflux uropathy: Secondary | ICD-10-CM | POA: Diagnosis not present

## 2019-07-29 DIAGNOSIS — C61 Malignant neoplasm of prostate: Secondary | ICD-10-CM | POA: Diagnosis not present

## 2019-07-29 DIAGNOSIS — N5201 Erectile dysfunction due to arterial insufficiency: Secondary | ICD-10-CM | POA: Diagnosis not present

## 2019-07-29 DIAGNOSIS — N433 Hydrocele, unspecified: Secondary | ICD-10-CM

## 2019-07-29 DIAGNOSIS — N401 Enlarged prostate with lower urinary tract symptoms: Secondary | ICD-10-CM

## 2019-07-29 NOTE — Patient Instructions (Signed)
Hydrocele, Adult  A hydrocele is a collection of fluid in the loose pouch of skin that holds the testicles (scrotum). This may happen because:  The amount of fluid produced in the scrotum is not absorbed by the rest of the body.  Fluid from the abdomen fills the scrotum. Normally, the testicles develop in the abdomen then move (drop) into to the scrotum before birth. The tube that the testicles travel through usually closes after the testicles drop. If the tube does not close, fluid from the abdomen can fill the scrotum. This is less common in adults.  What are the causes?  The cause of a hydrocele in adults is usually not known. However, it may be caused by:  An injury to the scrotum.  An infection (epididymitis).  Decreased blood flow to the scrotum.  Twisting of a testicle (testicular torsion).  A birth defect.  A tumor or cancer of the testicle.  What are the signs or symptoms?  A hydrocele feels like a water-filled balloon. It may also feel heavy. Other symptoms include:  Swelling of the scrotum. The swelling may decrease when you lie down. You may also notice more swelling at night than in the morning.  Swelling of the groin.  Mild discomfort in the scrotum.  Pain. This can develop if the hydrocele was caused by infection or twisting. The larger the hydrocele, the more likely you are to have pain.  How is this diagnosed?  This condition may be diagnosed based on:  Physical exam.  Medical history.  You may also have other tests, including:  Imaging tests, such as ultrasound.  Blood or urine tests.  How is this treated?  Most hydroceles go away on their own. If you have no discomfort or pain, your health care provider may suggest close monitoring of your condition (called watch and wait or watchful waiting) until the condition goes away or symptoms develop. If treatment is needed, it may include:  Treating an underlying condition. This may include using an antibiotic medicine to treat an infection.  Surgery to  stop fluid from collecting in the scrotum.  Surgery to drain the fluid. Options include:  Needle aspiration. A needle is used to drain fluid. However, the fluid buildup will come back quickly.  Hydrocelectomy. For this procedure, an incision is made in the scrotum to remove the fluid sac.  Follow these instructions at home:  Watch the hydrocele for any changes.  Take over-the-counter and prescription medicines only as told by your health care provider.  If you were prescribed an antibiotic medicine, use it as told by your health care provider. Do not stop taking the antibiotic even if you start to feel better.  Keep all follow-up visits as told by your health care provider. This is important.  Contact a health care provider if:  You notice any changes in the hydrocele.  The swelling in your scrotum or groin gets worse.  The hydrocele becomes red, firm, painful, or tender to the touch.  You have a fever.  Get help right away if you:  Develop a lot of pain, or your pain becomes worse.  Summary  A hydrocele is a collection of fluid in the loose pouch of skin that holds the testicles (scrotum).  Hydroceles can cause swelling, discomfort, and sometimes pain.  In adults, the cause of a hydrocele usually is not known. However, it is sometimes caused by an infection or a rotation and twisting of the scrotum.  Treatment is usually not   may be given to ease the pain. This information is not intended to replace advice given to you by your health care provider. Make sure you discuss any questions you have with your health care provider. Document Revised: 06/20/2017 Document Reviewed: 06/20/2017 Elsevier Patient Education  2020 [employer]  

## 2019-07-29 NOTE — Progress Notes (Signed)
07/29/2019 1:46 PM   Kirk Wright 10/20/1945 PO:[id number]  Referring provider: [name], NP [address] [place],  [place] [address]  Chief Complaint  Patient presents with  . Prostate Cancer    35mo follow up    HPI: 74 year old male with multiple GU issues including prostate cancer, BPH and ED who presents today for routine follow-up.  In addition to his normal complaints, he reports that he is becoming increasingly bothered by right groin swelling.  He is trouble getting comfortable at night especially when he turns over.  His pants are fitting more tightly.  Is been present for years but seems to be increasing in size.  Very low risk prostate cancer Patient underwent a prostate biopsy on 11/14/2012 with Dr. [name] for a PSA of 6.86. 58 gram prostate. Results were Gleason 3+3 cancer in 2/12 cores. 2% core of lateral mid and 8% of lateral apex. He was then referred to Dr. [name] on 01/25/2013 for possible robotic prostatectomy.He elected to defer this. He had a repeat prostate biopsy in May 2018 which was negative for disease.Patient chose to manage his prostate cancer with active surveillance.  Most recent imaging in the form of prostate MRI completed in 12/2017 showed no suspicious or high-grade lesions, PI-RADS 1.  Repeat MR 01/27/19 stable, no high grade lesions.  Markedly enlarged transition zone, prostomegaly 150 cc.  Most recent SPA down to 9.84 on 07/22/19.     Erectile dysfunction Severe erectile dysfunction managed with Trimix although not currently using it. He also uses a penile ring to help the erection last longer. He is considering a new device that he saw online. He briefly considered implant but has elected to continue pharmacological management.  BPH with obstruction He does also have a personal history of BPH. Prostate size estimated to be 78 nonincluding median lobe, 135 including median lobe. He has been on Flomax which is  helped significantly with his urinary symptoms. He is pleased with his voiding symptoms today. He feels like he has a good stream and able to empty his bladder completely.   No new urinary complaints, symptoms stable and well controlled.  He does mention today that if he forgets a dose or 2, he does have worsening issues.      PMH: Past Medical History:  Diagnosis Date  . Cataract   . COPD (chronic obstructive pulmonary disease) ([place])   . DJD (degenerative joint disease)   . Elevated PSA   . Erectile dysfunction   . Hypertension   . Neutropenia ([place])   . Obesity   . Obesity   . Pneumonia   . POAG (primary open-angle glaucoma)   . Pseudophakia of both eyes   . Sickle cell trait ([place])   . Sleep apnea   . Vitamin B 12 deficiency     Surgical History: Past Surgical History:  Procedure Laterality Date  . COLONOSCOPY WITH PROPOFOL N/A 02/23/2015   Procedure: COLONOSCOPY WITH PROPOFOL;  Surgeon: [name], MD;  Location: [facility] ENDOSCOPY;  Service: Endoscopy;  Laterality: N/A;  . EYE SURGERY    . GLAUCOMA SURGERY    . HERNIA REPAIR    . PROSTRATE BX      Home Medications:  Allergies as of 07/29/2019      Reactions   Contrast Media [iodinated Diagnostic Agents] Rash      Medication List       Accurate as of July 29, 2019 11:59 PM. If you have any questions,  ask your nurse or doctor.        STOP taking these medications   acetaZOLAMIDE 250 MG tablet Commonly known as: DIAMOX Stopped by: Hollice Espy, MD   brinzolamide 1 % ophthalmic suspension Commonly known as: AZOPT Stopped by: Hollice Espy, MD   gabapentin 100 MG capsule Commonly known as: NEURONTIN Stopped by: Hollice Espy, MD   prednisoLONE acetate 1 % ophthalmic suspension Commonly known as: PRED FORTE Stopped by: Hollice Espy, MD     TAKE these medications   aspirin 81 MG tablet Take 81 mg by mouth daily.   bimatoprost 0.03 % ophthalmic solution Commonly known as: LUMIGAN 1 drop  at bedtime.   brimonidine-timolol 0.2-0.5 % ophthalmic solution Commonly known as: COMBIGAN Place 1 drop into both eyes every 12 (twelve) hours.   budesonide-formoterol 160-4.5 MCG/ACT inhaler Commonly known as: SYMBICORT   ferrous sulfate 325 (65 FE) MG tablet Take 325 mg by mouth daily with breakfast.   ibuprofen 200 MG tablet Commonly known as: ADVIL Take 200 mg by mouth every 6 (six) hours as needed.   lisinopril-hydrochlorothiazide 10-12.5 MG tablet Commonly known as: ZESTORETIC Take 1 tablet by mouth daily.   meloxicam 15 MG tablet Commonly known as: MOBIC Take 15 mg by mouth daily.   ProAir HFA 108 (90 Base) MCG/ACT inhaler Generic drug: albuterol TAKE 2 PUFFS INTO THE LUNGS EVERY 6 HOURS AS NEEDED FOR WHEEZING OR SHORTNESS OF BREATH   Spiriva HandiHaler 18 MCG inhalation capsule Generic drug: tiotropium Place into inhaler and inhale.   tamsulosin 0.4 MG Caps capsule Commonly known as: FLOMAX TAKE 1 CAPSULE (0.4 MG TOTAL) BY MOUTH ONCE DAILY. TAKE 30 MINUTES AFTER SAME MEAL EACH DAY.       Allergies:  Allergies  Allergen Reactions  . Contrast Media [Iodinated Diagnostic Agents] Rash    Family History: Family History  Problem Relation Age of Onset  . Kidney cancer Neg Hx   . Kidney disease Neg Hx   . Prostate cancer Neg Hx   . Bladder Cancer Neg Hx     Social History:  reports that he has quit smoking. He has quit using smokeless tobacco. He reports that he does not drink alcohol or use drugs.  ROS: UROLOGY Frequent Urination?: No Hard to postpone urination?: No Burning/pain with urination?: No Get up at night to urinate?: No Leakage of urine?: No Urine stream starts and stops?: No Trouble starting stream?: No Do you have to strain to urinate?: No Blood in urine?: No Urinary tract infection?: No Sexually transmitted disease?: No Injury to kidneys or bladder?: No Painful intercourse?: No Weak stream?: No Erection problems?: No Penile  pain?: No  Gastrointestinal Nausea?: No Vomiting?: No Indigestion/heartburn?: No Diarrhea?: No Constipation?: No  Constitutional Fever: No Night sweats?: No Weight loss?: No Fatigue?: No  Skin Skin rash/lesions?: No Itching?: No  Eyes Blurred vision?: No Double vision?: No  Ears/Nose/Throat Sore throat?: No Sinus problems?: No  Hematologic/Lymphatic Swollen glands?: No Easy bruising?: No  Cardiovascular Leg swelling?: No Chest pain?: No  Respiratory Cough?: No Shortness of breath?: No  Endocrine Excessive thirst?: No  Musculoskeletal Back pain?: No Joint pain?: No  Neurological Headaches?: No Dizziness?: No  Psychologic Depression?: No Anxiety?: No  Physical Exam: BP 131/68   Pulse 72   Ht 6' (1.829 m)   Wt 225 lb (102.1 kg)   BMI 30.52 kg/m   Constitutional:  Alert and oriented, No acute distress. HEENT: [place] AT, moist mucus membranes.  Trachea midline, no masses. Cardiovascular:  No clubbing, cyanosis, or edema. Respiratory: Normal respiratory effort, no increased work of breathing. GI: Abdomen is soft, nontender, nondistended, no abdominal masses GU: Honeydew melon sized right hydrocele, unable to palpate testicle.  Left testicle normal.  No scrotal skin changes.  Normal phallus. Rectal: Normal sphincter tone, 60+ cc prostate, only able to palpate apex and mid gland which is unremarkable without nodules. Skin: No rashes, bruises or suspicious lesions. Neurologic: Grossly intact, no focal deficits, moving all 4 extremities. Psychiatric: Normal mood and affect.  Laboratory Data: Lab Results  Component Value Date   WBC 5.8 04/02/2014   HGB 12.9 (L) 04/02/2014   HCT 39.5 (L) 04/02/2014   MCV 91 04/02/2014   PLT 204 04/02/2014    Lab Results  Component Value Date   CREATININE 1.50 (H) 01/27/2019   PSA as above  Assessment & Plan:    1. Prostate cancer [facility]) Presents for very low risk prostate cancer on active surveillance  PSA  trending up slightly however patient does have massive prostamegaly, will continue to follow every 6 month basis  Most recent prostate MRI was reassuring  2. BPH with urinary obstruction Symptoms well controlled on Flomax  Would strongly recommend consideration of an outlet procedure in the form of holmium laser enucleation of the prostate should he ever want to stop medication, will consider this but not yet willing or ready to proceed at this time  3. Erectile dysfunction due to arterial insufficiency Not currently using Trimix  4. Right hydrocele Increasing bother from the right hydrocele  This point time, he is interested in management.  We discussed bedside aspiration versus hydrocelectomy.  Efficacy rates of each were discussed.  We will strongly recommend hydrocelectomy.  We discussed the recurrence rate approximately 10%.  Discussed other risk and benefits including risk of bleeding, infection, damage surrounding structures, recurrence, need for further procedures amongst others.  We discussed the postoperative course and activity restrictions.  All of his questions were answered.  We will book him at his earliest convenience for this elective procedure.   Return in about 6 months (around 01/26/2020) for PSA prior.  Hollice Espy, MD  [facility] [address], [place], [place] [address] [phone]

## 2019-07-29 NOTE — H&P (View-Only) (Signed)
07/29/2019 1:46 PM   Kirk Wright 1945/07/17 VS:[id number]  Referring provider: [name], NP [address] [place],  [place] [address]  Chief Complaint  Patient presents with  . Prostate Cancer    24mo follow up    HPI: 74 year old male with multiple GU issues including prostate cancer, BPH and ED who presents today for routine follow-up.  In addition to his normal complaints, he reports that he is becoming increasingly bothered by right groin swelling.  He is trouble getting comfortable at night especially when he turns over.  His pants are fitting more tightly.  Is been present for years but seems to be increasing in size.  Very low risk prostate cancer Patient underwent a prostate biopsy on 11/14/2012 with Dr. [name] for a PSA of 6.86. 58 gram prostate. Results were Gleason 3+3 cancer in 2/12 cores. 2% core of lateral mid and 8% of lateral apex. He was then referred to Dr. [name] on 01/25/2013 for possible robotic prostatectomy.He elected to defer this. He had a repeat prostate biopsy in May 2018 which was negative for disease.Patient chose to manage his prostate cancer with active surveillance.  Most recent imaging in the form of prostate MRI completed in 12/2017 showed no suspicious or high-grade lesions, PI-RADS 1.  Repeat MR 01/27/19 stable, no high grade lesions.  Markedly enlarged transition zone, prostomegaly 150 cc.  Most recent SPA down to 9.84 on 07/22/19.     Erectile dysfunction Severe erectile dysfunction managed with Trimix although not currently using it. He also uses a penile ring to help the erection last longer. He is considering a new device that he saw online. He briefly considered implant but has elected to continue pharmacological management.  BPH with obstruction He does also have a personal history of BPH. Prostate size estimated to be 78 nonincluding median lobe, 135 including median lobe. He has been on Flomax which is  helped significantly with his urinary symptoms. He is pleased with his voiding symptoms today. He feels like he has a good stream and able to empty his bladder completely.   No new urinary complaints, symptoms stable and well controlled.  He does mention today that if he forgets a dose or 2, he does have worsening issues.      PMH: Past Medical History:  Diagnosis Date  . Cataract   . COPD (chronic obstructive pulmonary disease) ([place])   . DJD (degenerative joint disease)   . Elevated PSA   . Erectile dysfunction   . Hypertension   . Neutropenia ([place])   . Obesity   . Obesity   . Pneumonia   . POAG (primary open-angle glaucoma)   . Pseudophakia of both eyes   . Sickle cell trait ([place])   . Sleep apnea   . Vitamin B 12 deficiency     Surgical History: Past Surgical History:  Procedure Laterality Date  . COLONOSCOPY WITH PROPOFOL N/A 02/23/2015   Procedure: COLONOSCOPY WITH PROPOFOL;  Surgeon: [name], MD;  Location: [facility] ENDOSCOPY;  Service: Endoscopy;  Laterality: N/A;  . EYE SURGERY    . GLAUCOMA SURGERY    . HERNIA REPAIR    . PROSTRATE BX      Home Medications:  Allergies as of 07/29/2019      Reactions   Contrast Media [iodinated Diagnostic Agents] Rash      Medication List       Accurate as of July 29, 2019 11:59 PM. If you have any questions,  ask your nurse or doctor.        STOP taking these medications   acetaZOLAMIDE 250 MG tablet Commonly known as: DIAMOX Stopped by: Hollice Espy, MD   brinzolamide 1 % ophthalmic suspension Commonly known as: AZOPT Stopped by: Hollice Espy, MD   gabapentin 100 MG capsule Commonly known as: NEURONTIN Stopped by: Hollice Espy, MD   prednisoLONE acetate 1 % ophthalmic suspension Commonly known as: PRED FORTE Stopped by: Hollice Espy, MD     TAKE these medications   aspirin 81 MG tablet Take 81 mg by mouth daily.   bimatoprost 0.03 % ophthalmic solution Commonly known as: LUMIGAN 1 drop  at bedtime.   brimonidine-timolol 0.2-0.5 % ophthalmic solution Commonly known as: COMBIGAN Place 1 drop into both eyes every 12 (twelve) hours.   budesonide-formoterol 160-4.5 MCG/ACT inhaler Commonly known as: SYMBICORT   ferrous sulfate 325 (65 FE) MG tablet Take 325 mg by mouth daily with breakfast.   ibuprofen 200 MG tablet Commonly known as: ADVIL Take 200 mg by mouth every 6 (six) hours as needed.   lisinopril-hydrochlorothiazide 10-12.5 MG tablet Commonly known as: ZESTORETIC Take 1 tablet by mouth daily.   meloxicam 15 MG tablet Commonly known as: MOBIC Take 15 mg by mouth daily.   ProAir HFA 108 (90 Base) MCG/ACT inhaler Generic drug: albuterol TAKE 2 PUFFS INTO THE LUNGS EVERY 6 HOURS AS NEEDED FOR WHEEZING OR SHORTNESS OF BREATH   Spiriva HandiHaler 18 MCG inhalation capsule Generic drug: tiotropium Place into inhaler and inhale.   tamsulosin 0.4 MG Caps capsule Commonly known as: FLOMAX TAKE 1 CAPSULE (0.4 MG TOTAL) BY MOUTH ONCE DAILY. TAKE 30 MINUTES AFTER SAME MEAL EACH DAY.       Allergies:  Allergies  Allergen Reactions  . Contrast Media [Iodinated Diagnostic Agents] Rash    Family History: Family History  Problem Relation Age of Onset  . Kidney cancer Neg Hx   . Kidney disease Neg Hx   . Prostate cancer Neg Hx   . Bladder Cancer Neg Hx     Social History:  reports that he has quit smoking. He has quit using smokeless tobacco. He reports that he does not drink alcohol or use drugs.  ROS: UROLOGY Frequent Urination?: No Hard to postpone urination?: No Burning/pain with urination?: No Get up at night to urinate?: No Leakage of urine?: No Urine stream starts and stops?: No Trouble starting stream?: No Do you have to strain to urinate?: No Blood in urine?: No Urinary tract infection?: No Sexually transmitted disease?: No Injury to kidneys or bladder?: No Painful intercourse?: No Weak stream?: No Erection problems?: No Penile  pain?: No  Gastrointestinal Nausea?: No Vomiting?: No Indigestion/heartburn?: No Diarrhea?: No Constipation?: No  Constitutional Fever: No Night sweats?: No Weight loss?: No Fatigue?: No  Skin Skin rash/lesions?: No Itching?: No  Eyes Blurred vision?: No Double vision?: No  Ears/Nose/Throat Sore throat?: No Sinus problems?: No  Hematologic/Lymphatic Swollen glands?: No Easy bruising?: No  Cardiovascular Leg swelling?: No Chest pain?: No  Respiratory Cough?: No Shortness of breath?: No  Endocrine Excessive thirst?: No  Musculoskeletal Back pain?: No Joint pain?: No  Neurological Headaches?: No Dizziness?: No  Psychologic Depression?: No Anxiety?: No  Physical Exam: BP 131/68   Pulse 72   Ht 6' (1.829 m)   Wt 225 lb (102.1 kg)   BMI 30.52 kg/m   Constitutional:  Alert and oriented, No acute distress. HEENT: [place] AT, moist mucus membranes.  Trachea midline, no masses. Cardiovascular:  No clubbing, cyanosis, or edema. Respiratory: Normal respiratory effort, no increased work of breathing. GI: Abdomen is soft, nontender, nondistended, no abdominal masses GU: Honeydew melon sized right hydrocele, unable to palpate testicle.  Left testicle normal.  No scrotal skin changes.  Normal phallus. Rectal: Normal sphincter tone, 60+ cc prostate, only able to palpate apex and mid gland which is unremarkable without nodules. Skin: No rashes, bruises or suspicious lesions. Neurologic: Grossly intact, no focal deficits, moving all 4 extremities. Psychiatric: Normal mood and affect.  Laboratory Data: Lab Results  Component Value Date   WBC 5.8 04/02/2014   HGB 12.9 (L) 04/02/2014   HCT 39.5 (L) 04/02/2014   MCV 91 04/02/2014   PLT 204 04/02/2014    Lab Results  Component Value Date   CREATININE 1.50 (H) 01/27/2019   PSA as above  Assessment & Plan:    1. Prostate cancer [facility]) Presents for very low risk prostate cancer on active surveillance  PSA  trending up slightly however patient does have massive prostamegaly, will continue to follow every 6 month basis  Most recent prostate MRI was reassuring  2. BPH with urinary obstruction Symptoms well controlled on Flomax  Would strongly recommend consideration of an outlet procedure in the form of holmium laser enucleation of the prostate should he ever want to stop medication, will consider this but not yet willing or ready to proceed at this time  3. Erectile dysfunction due to arterial insufficiency Not currently using Trimix  4. Right hydrocele Increasing bother from the right hydrocele  This point time, he is interested in management.  We discussed bedside aspiration versus hydrocelectomy.  Efficacy rates of each were discussed.  We will strongly recommend hydrocelectomy.  We discussed the recurrence rate approximately 10%.  Discussed other risk and benefits including risk of bleeding, infection, damage surrounding structures, recurrence, need for further procedures amongst others.  We discussed the postoperative course and activity restrictions.  All of his questions were answered.  We will book him at his earliest convenience for this elective procedure.   Return in about 6 months (around 01/26/2020) for PSA prior.  Hollice Espy, MD  [facility] [address] [place], [place] [address] [phone]

## 2019-07-31 ENCOUNTER — Encounter: Payer: Self-pay | Admitting: Urology

## 2019-08-02 ENCOUNTER — Telehealth: Payer: Self-pay | Admitting: Radiology

## 2019-08-02 ENCOUNTER — Other Ambulatory Visit: Payer: Self-pay | Admitting: Radiology

## 2019-08-02 NOTE — Telephone Encounter (Signed)
LMOM to return call. Need to discuss surgery with Dr Erlene Quan.

## 2019-08-04 ENCOUNTER — Other Ambulatory Visit: Payer: Self-pay | Admitting: Radiology

## 2019-08-04 DIAGNOSIS — N433 Hydrocele, unspecified: Secondary | ICD-10-CM

## 2019-08-10 ENCOUNTER — Other Ambulatory Visit: Payer: Self-pay

## 2019-08-10 ENCOUNTER — Encounter
Admission: RE | Admit: 2019-08-10 | Discharge: 2019-08-10 | Disposition: A | Discharge status: Home / Self Care | Payer: PPO | Source: Ambulatory Visit | Attending: Urology | Admitting: Urology

## 2019-08-10 NOTE — Patient Instructions (Signed)
Your procedure is scheduled on: 08/15/19 Report to [facility]. To find out your arrival time please call [phone] between 1PM - 3PM on 08/12/19.  Remember: Instructions that are not followed completely may result in serious medical risk, up to and including death, or upon the discretion of your surgeon and anesthesiologist your surgery may need to be rescheduled.     _X__ 1. Do not eat food after midnight the night before your procedure.                 No gum chewing or hard candies. You may drink clear liquids up to 2 hours                 before you are scheduled to arrive for your surgery- DO not drink clear                 liquids within 2 hours of the start of your surgery.                 Clear Liquids include:  water, apple juice without pulp, clear carbohydrate                 drink such as Clearfast or Gatorade, Black Coffee or Tea (Do not add                 anything to coffee or tea). Diabetics water only  __X__2.  On the morning of surgery brush your teeth with toothpaste and water, you                 may rinse your mouth with mouthwash if you wish.  Do not swallow any              toothpaste of mouthwash.     _X__ 3.  No Alcohol for 24 hours before or after surgery.   _X__ 4.  Do Not Smoke or use e-cigarettes For 24 Hours Prior to Your Surgery.                 Do not use any chewable tobacco products for at least 6 hours prior to                 surgery.  ____  5.  Bring all medications with you on the day of surgery if instructed.   __X__  6.  Notify your doctor if there is any change in your medical condition      (cold, fever, infections).     Do not wear jewelry, make-up, hairpins, clips or nail polish. Do not wear lotions, powders, or perfumes.  Do not shave 48 hours prior to surgery. Men may shave face and neck. Do not bring valuables to the hospital.    [facility] is not responsible for any belongings or  valuables.  Contacts, dentures/partials or body piercings may not be worn into surgery. Bring a case for your contacts, glasses or hearing aids, a denture cup will be supplied. Leave your suitcase in the car. After surgery it may be brought to your room. For patients admitted to the hospital, discharge time is determined by your treatment team.   Patients discharged the day of surgery will not be allowed to drive home.   Please read over the following fact sheets that you were given:   MRSA Information  __X__ Take these medicines the morning of surgery with A SIP OF WATER:  1. tamsulosin (FLOMAX) 0.4 MG CAPS capsule  2.   3.   4.  5.  6.  ____ Fleet Enema (as directed)   __X__ Use CHG Soap/SAGE wipes as directed  __X__ Use inhalers on the day of surgery  USE YOUR SYMBICORT  ____ Stop metformin/Janumet/Farxiga 2 days prior to surgery    ____ Take 1/2 of usual insulin dose the night before surgery. No insulin the morning          of surgery.   ____ Stop Blood Thinners Coumadin/Plavix/Xarelto/Pleta/Pradaxa/Eliquis/Effient/Aspirin  on   Or contact your Surgeon, Cardiologist or Medical Doctor regarding  ability to stop your blood thinners  __X__ Stop Anti-inflammatories 7 days before surgery such as Advil, Ibuprofen, Motrin,  BC or Goodies Powder, Naprosyn, Naproxen, Aleve, Aspirin    __X__ Stop all herbal supplements, fish oil or vitamin E until after surgery.    ____ Bring C-Pap to the hospital.

## 2019-08-11 ENCOUNTER — Encounter
Admission: RE | Admit: 2019-08-11 | Discharge: 2019-08-11 | Disposition: A | Discharge status: Home / Self Care | Payer: PPO | Source: Ambulatory Visit | Attending: Urology | Admitting: Urology

## 2019-08-11 DIAGNOSIS — Z01812 Encounter for preprocedural laboratory examination: Secondary | ICD-10-CM | POA: Insufficient documentation

## 2019-08-11 DIAGNOSIS — Z87891 Personal history of nicotine dependence: Secondary | ICD-10-CM | POA: Insufficient documentation

## 2019-08-11 DIAGNOSIS — Z20822 Contact with and (suspected) exposure to covid-19: Secondary | ICD-10-CM | POA: Insufficient documentation

## 2019-08-11 DIAGNOSIS — I10 Essential (primary) hypertension: Secondary | ICD-10-CM | POA: Insufficient documentation

## 2019-08-11 LAB — BASIC METABOLIC PANEL
Anion gap: 10 (ref 5–15)
BUN: 23 mg/dL (ref 8–23)
CO2: 26 mmol/L (ref 22–32)
Calcium: 9 mg/dL (ref 8.9–10.3)
Chloride: 104 mmol/L (ref 98–111)
Creatinine, Ser: 1.61 mg/dL — ABNORMAL HIGH (ref 0.61–1.24)
GFR calc Af Amer: 49 mL/min — ABNORMAL LOW (ref >60)
GFR calc non Af Amer: 42 mL/min — ABNORMAL LOW (ref >60)
Glucose, Bld: 97 mg/dL (ref 70–99)
Potassium: 3.4 mmol/L — ABNORMAL LOW (ref 3.5–5.1)
Sodium: 140 mmol/L (ref 135–145)

## 2019-08-11 LAB — CBC
HCT: 37.1 % — ABNORMAL LOW (ref 39.0–52.0)
Hemoglobin: 12.3 g/dL — ABNORMAL LOW (ref 13.0–17.0)
MCH: 29.2 pg (ref 26.0–34.0)
MCHC: 33.2 g/dL (ref 30.0–36.0)
MCV: 88.1 fL (ref 80.0–100.0)
Platelets: 202 10*3/uL (ref 150–400)
RBC: 4.21 MIL/uL — ABNORMAL LOW (ref 4.22–5.81)
RDW: 13.2 % (ref 11.5–15.5)
WBC: 5.5 10*3/uL (ref 4.0–10.5)
nRBC: 0 % (ref 0.0–0.2)

## 2019-08-11 LAB — SARS CORONAVIRUS 2 (TAT 6-24 HRS): SARS Coronavirus 2: NEGATIVE

## 2019-08-11 NOTE — Pre-Procedure Instructions (Addendum)
EKG reviewed with Dr Ronelle Nigh..  States "OK to proceed".

## 2019-08-15 ENCOUNTER — Ambulatory Visit: Payer: PPO | Admitting: Anesthesiology

## 2019-08-15 ENCOUNTER — Other Ambulatory Visit: Payer: Self-pay

## 2019-08-15 ENCOUNTER — Encounter: Payer: Self-pay | Admitting: Urology

## 2019-08-15 ENCOUNTER — Encounter
Admission: RE | Disposition: A | Discharge status: Home / Self Care | Payer: Self-pay | Source: Home / Self Care | Attending: Urology

## 2019-08-15 ENCOUNTER — Ambulatory Visit
Admission: RE | Admit: 2019-08-15 | Discharge: 2019-08-15 | Disposition: A | Discharge status: Home / Self Care | Payer: PPO | Attending: Urology | Admitting: Urology

## 2019-08-15 DIAGNOSIS — N433 Hydrocele, unspecified: Secondary | ICD-10-CM | POA: Diagnosis not present

## 2019-08-15 DIAGNOSIS — C61 Malignant neoplasm of prostate: Secondary | ICD-10-CM | POA: Diagnosis not present

## 2019-08-15 DIAGNOSIS — D649 Anemia, unspecified: Secondary | ICD-10-CM | POA: Insufficient documentation

## 2019-08-15 DIAGNOSIS — E669 Obesity, unspecified: Secondary | ICD-10-CM | POA: Diagnosis not present

## 2019-08-15 DIAGNOSIS — D573 Sickle-cell trait: Secondary | ICD-10-CM | POA: Diagnosis not present

## 2019-08-15 DIAGNOSIS — N401 Enlarged prostate with lower urinary tract symptoms: Secondary | ICD-10-CM | POA: Diagnosis not present

## 2019-08-15 DIAGNOSIS — Z791 Long term (current) use of non-steroidal anti-inflammatories (NSAID): Secondary | ICD-10-CM | POA: Diagnosis not present

## 2019-08-15 DIAGNOSIS — G4730 Sleep apnea, unspecified: Secondary | ICD-10-CM | POA: Diagnosis not present

## 2019-08-15 DIAGNOSIS — J449 Chronic obstructive pulmonary disease, unspecified: Secondary | ICD-10-CM | POA: Insufficient documentation

## 2019-08-15 DIAGNOSIS — I10 Essential (primary) hypertension: Secondary | ICD-10-CM | POA: Insufficient documentation

## 2019-08-15 DIAGNOSIS — Z6830 Body mass index (BMI) 30.0-30.9, adult: Secondary | ICD-10-CM | POA: Insufficient documentation

## 2019-08-15 DIAGNOSIS — Z87891 Personal history of nicotine dependence: Secondary | ICD-10-CM | POA: Insufficient documentation

## 2019-08-15 DIAGNOSIS — N529 Male erectile dysfunction, unspecified: Secondary | ICD-10-CM | POA: Diagnosis not present

## 2019-08-15 DIAGNOSIS — Z79899 Other long term (current) drug therapy: Secondary | ICD-10-CM | POA: Diagnosis not present

## 2019-08-15 DIAGNOSIS — E785 Hyperlipidemia, unspecified: Secondary | ICD-10-CM | POA: Diagnosis not present

## 2019-08-15 DIAGNOSIS — Z7982 Long term (current) use of aspirin: Secondary | ICD-10-CM | POA: Diagnosis not present

## 2019-08-15 DIAGNOSIS — N138 Other obstructive and reflux uropathy: Secondary | ICD-10-CM | POA: Insufficient documentation

## 2019-08-15 DIAGNOSIS — N430 Encysted hydrocele: Secondary | ICD-10-CM

## 2019-08-15 HISTORY — PX: HYDROCELE EXCISION: SHX482

## 2019-08-15 SURGERY — HYDROCELECTOMY
Anesthesia: General | Site: Scrotum | Laterality: Right

## 2019-08-15 MED ORDER — FENTANYL CITRATE (PF) 100 MCG/2ML IJ SOLN
25.0000 ug | INTRAMUSCULAR | Status: DC | PRN
  Administered 2019-08-15 (×3): 25 ug via INTRAVENOUS

## 2019-08-15 MED ORDER — FENTANYL CITRATE (PF) 100 MCG/2ML IJ SOLN
INTRAMUSCULAR | Status: AC
  Filled 2019-08-15: qty 2

## 2019-08-15 MED ORDER — IPRATROPIUM-ALBUTEROL 0.5-2.5 (3) MG/3ML IN SOLN
RESPIRATORY_TRACT | Status: AC
  Administered 2019-08-15: 18:00:00 3 mL via RESPIRATORY_TRACT
  Filled 2019-08-15: qty 3

## 2019-08-15 MED ORDER — SUGAMMADEX SODIUM 200 MG/2ML IV SOLN
INTRAVENOUS | Status: DC | PRN
  Administered 2019-08-15: 204.2 mg via INTRAVENOUS

## 2019-08-15 MED ORDER — CEFAZOLIN SODIUM-DEXTROSE 2-4 GM/100ML-% IV SOLN
2.0000 g | INTRAVENOUS | Status: AC
  Administered 2019-08-15: 2 g via INTRAVENOUS

## 2019-08-15 MED ORDER — BUPIVACAINE HCL 0.5 % IJ SOLN
INTRAMUSCULAR | Status: DC | PRN
  Administered 2019-08-15: 10 mL

## 2019-08-15 MED ORDER — BUPIVACAINE HCL (PF) 0.5 % IJ SOLN
INTRAMUSCULAR | Status: AC
  Filled 2019-08-15: qty 30

## 2019-08-15 MED ORDER — ONDANSETRON HCL 4 MG/2ML IJ SOLN
4.0000 mg | Freq: Once | INTRAMUSCULAR | Status: AC | PRN
  Administered 2019-08-15: 4 mg via INTRAVENOUS

## 2019-08-15 MED ORDER — LACTATED RINGERS IV SOLN: INTRAVENOUS | Status: DC

## 2019-08-15 MED ORDER — IPRATROPIUM-ALBUTEROL 0.5-2.5 (3) MG/3ML IN SOLN
RESPIRATORY_TRACT | Status: AC
  Administered 2019-08-15: 14:00:00 3 mL via RESPIRATORY_TRACT
  Filled 2019-08-15: qty 3

## 2019-08-15 MED ORDER — HYDROCODONE-ACETAMINOPHEN 5-325 MG PO TABS: 1.0000 | ORAL_TABLET | Freq: Four times a day (QID) | ORAL | 0 refills | Status: DC | PRN

## 2019-08-15 MED ORDER — FAMOTIDINE 20 MG PO TABS
ORAL_TABLET | ORAL | Status: AC
  Administered 2019-08-15: 13:00:00 20 mg via ORAL
  Filled 2019-08-15: qty 1

## 2019-08-15 MED ORDER — FAMOTIDINE 20 MG PO TABS: 20.0000 mg | ORAL_TABLET | Freq: Once | ORAL | Status: AC

## 2019-08-15 MED ORDER — MIDAZOLAM HCL 2 MG/2ML IJ SOLN
INTRAMUSCULAR | Status: DC | PRN
  Administered 2019-08-15: 2 mg via INTRAVENOUS

## 2019-08-15 MED ORDER — CEFAZOLIN SODIUM-DEXTROSE 2-4 GM/100ML-% IV SOLN
INTRAVENOUS | Status: AC
  Filled 2019-08-15: qty 100

## 2019-08-15 MED ORDER — FENTANYL CITRATE (PF) 100 MCG/2ML IJ SOLN
25.0000 ug | INTRAMUSCULAR | Status: DC | PRN
  Administered 2019-08-15 (×2): 50 ug via INTRAVENOUS

## 2019-08-15 MED ORDER — IPRATROPIUM-ALBUTEROL 0.5-2.5 (3) MG/3ML IN SOLN: 3.0000 mL | Freq: Once | RESPIRATORY_TRACT | Status: AC

## 2019-08-15 MED ORDER — MIDAZOLAM HCL 2 MG/2ML IJ SOLN
INTRAMUSCULAR | Status: AC
  Filled 2019-08-15: qty 2

## 2019-08-15 MED ORDER — DOCUSATE SODIUM 100 MG PO CAPS: 100.0000 mg | ORAL_CAPSULE | Freq: Two times a day (BID) | ORAL | 0 refills | Status: DC

## 2019-08-15 MED ORDER — LIDOCAINE HCL (CARDIAC) PF 100 MG/5ML IV SOSY
PREFILLED_SYRINGE | INTRAVENOUS | Status: DC | PRN
  Administered 2019-08-15: 80 mg via INTRAVENOUS

## 2019-08-15 MED ORDER — PROPOFOL 10 MG/ML IV BOLUS
INTRAVENOUS | Status: DC | PRN
  Administered 2019-08-15: 180 mg via INTRAVENOUS

## 2019-08-15 MED ORDER — ROCURONIUM BROMIDE 100 MG/10ML IV SOLN
INTRAVENOUS | Status: DC | PRN
  Administered 2019-08-15: 30 mg via INTRAVENOUS

## 2019-08-15 MED ORDER — FENTANYL CITRATE (PF) 100 MCG/2ML IJ SOLN
INTRAMUSCULAR | Status: AC
  Administered 2019-08-15: 18:00:00 25 ug via INTRAVENOUS
  Filled 2019-08-15: qty 2

## 2019-08-15 MED ORDER — NALOXONE HCL 0.4 MG/ML IJ SOLN
INTRAMUSCULAR | Status: DC | PRN
  Administered 2019-08-15: 40 ug via INTRAVENOUS

## 2019-08-15 MED ORDER — DEXAMETHASONE SODIUM PHOSPHATE 10 MG/ML IJ SOLN
INTRAMUSCULAR | Status: DC | PRN
  Administered 2019-08-15: 10 mg via INTRAVENOUS

## 2019-08-15 SURGICAL SUPPLY — 37 items
BLADE CLIPPER SURG (BLADE) ×3 IMPLANT
BLADE SURG 15 STRL LF DISP TIS (BLADE) ×1 IMPLANT
BLADE SURG 15 STRL SS (BLADE) ×2
CANISTER SUCT 1200ML W/VALVE (MISCELLANEOUS) ×3 IMPLANT
CHLORAPREP W/TINT 26 (MISCELLANEOUS) ×3 IMPLANT
COVER WAND RF STERILE (DRAPES) ×3 IMPLANT
DERMABOND ADVANCED (GAUZE/BANDAGES/DRESSINGS) ×2
DERMABOND ADVANCED .7 DNX12 (GAUZE/BANDAGES/DRESSINGS) ×1 IMPLANT
DRAIN PENROSE 1/4X12 LTX STRL (WOUND CARE) IMPLANT
DRAPE LAPAROTOMY 77X122 PED (DRAPES) ×3 IMPLANT
DRSG GAUZE FLUFF 36X18 (GAUZE/BANDAGES/DRESSINGS) ×3 IMPLANT
ELECT REM PT RETURN 9FT ADLT (ELECTROSURGICAL) ×3
ELECTRODE REM PT RTRN 9FT ADLT (ELECTROSURGICAL) ×1 IMPLANT
GAUZE SPONGE 4X4 12PLY STRL (GAUZE/BANDAGES/DRESSINGS) IMPLANT
GLOVE BIO SURGEON STRL SZ 6.5 (GLOVE) ×2 IMPLANT
GLOVE BIO SURGEONS STRL SZ 6.5 (GLOVE) ×1
GOWN STRL REUS W/ TWL LRG LVL3 (GOWN DISPOSABLE) ×2 IMPLANT
GOWN STRL REUS W/TWL LRG LVL3 (GOWN DISPOSABLE) ×4
KIT TURNOVER KIT A (KITS) ×3 IMPLANT
LABEL OR SOLS (LABEL) ×3 IMPLANT
NEEDLE HYPO 25X1 1.5 SAFETY (NEEDLE) ×3 IMPLANT
NS IRRIG 500ML POUR BTL (IV SOLUTION) ×3 IMPLANT
PACK BASIN MINOR ARMC (MISCELLANEOUS) ×3 IMPLANT
SUPPORETR ATHLETIC LG (MISCELLANEOUS) ×1 IMPLANT
SUPPORTER ATHLETIC LG (MISCELLANEOUS) ×3
SUT CHROMIC 3 0 PS 2 (SUTURE) ×6 IMPLANT
SUT CHROMIC 3 0 SH 27 (SUTURE) IMPLANT
SUT ETHILON 3-0 FS-10 30 BLK (SUTURE)
SUT ETHILON NAB PS2 4-0 18IN (SUTURE) IMPLANT
SUT VIC AB 2-0 SH 27 (SUTURE) ×2
SUT VIC AB 2-0 SH 27XBRD (SUTURE) ×1 IMPLANT
SUT VIC AB 3-0 SH 27 (SUTURE) ×2
SUT VIC AB 3-0 SH 27X BRD (SUTURE) ×1 IMPLANT
SUT VIC AB 4-0 SH 27 (SUTURE)
SUT VIC AB 4-0 SH 27XANBCTRL (SUTURE) IMPLANT
SUTURE EHLN 3-0 FS-10 30 BLK (SUTURE) IMPLANT
SYR 10ML LL (SYRINGE) ×3 IMPLANT

## 2019-08-15 NOTE — Interval H&P Note (Signed)
History and Physical Interval Note:  08/15/2019 4:46 PM  Kirk Wright  has presented today for surgery, with the diagnosis of right hydrocele.  The various methods of treatment have been discussed with the patient and family. After consideration of risks, benefits and other options for treatment, the patient has consented to  Procedure(s): HYDROCELECTOMY ADULT (Right) as a surgical intervention.  The patient's history has been reviewed, patient examined, no change in status, stable for surgery.  I have reviewed the patient's chart and labs.  Questions were answered to the patient's satisfaction.    RRR CTAB   Hollice Espy

## 2019-08-15 NOTE — Anesthesia Procedure Notes (Signed)

## 2019-08-15 NOTE — Transfer of Care (Signed)
Immediate Anesthesia Transfer of Care Note  Patient: Kirk Wright  Procedure(s) Performed: HYDROCELECTOMY ADULT (Right Scrotum)  Patient Location: PACU  Anesthesia Type:General  Level of Consciousness: awake and sedated  Airway & Oxygen Therapy: Patient Spontanous Breathing  Post-op Assessment: Report given to RN and Post -op Vital signs reviewed and stable  Post vital signs: Reviewed and stable  Last Vitals:  Vitals Value Taken Time  BP    Temp    Pulse 92 08/15/19 1756  Resp 25 08/15/19 1756  SpO2 92 % 08/15/19 1756  Vitals shown include unvalidated device data.  Last Pain:  Vitals:   08/15/19 1233  TempSrc: Oral  PainSc: 0-No pain         Complications: No apparent anesthesia complications

## 2019-08-15 NOTE — Op Note (Signed)
Date of procedure: 08/15/19  Preoperative diagnosis:  Right hydrocele   Postoperative diagnosis:  Right hydrocele   Procedure: Right hydrocelectomy  Surgeon: Hollice Espy, MD  Anesthesia: General  Complications: None  Intraoperative findings: 200 cc straw colored fluid in hydrocele sac  EBL: minimal  Specimens: none  Drains: none  Indication: Kirk Wright is a 74 y.o. patient with symptomatic right hydrocele.  After reviewing the management options for treatment, he elected to proceed with the above surgical procedure(s). We have discussed the potential benefits and risks of the procedure, side effects of the proposed treatment, the likelihood of the patient achieving the goals of the procedure, and any potential problems that might occur during the procedure or recuperation. Informed consent has been obtained.  Description of procedure:  The patient was taken to the operating room and general anesthesia was induced.  The patient was placed in the supine position, prepped and draped in the usual sterile fashion, and preoperative antibiotics were administered. A preoperative time-out was performed.   Right-sided hydrocele was confirmed on exam.  A transverse incision was 6 cm across the right mid hemiscrotum.  This was carried down through the subcutaneous tissues using Bovie cautery until the hydrocele sac was identified, bluntly dissected and delivered through the incision.  Hydrocele sac was then opened and 200 cc of straw-colored fluid was drained.  The edges of the sac were excised using Bovie electrocautery.  These were then diverted and reapproximated to the level of the mid cord using 3-0 Vicryl suture for hemostasis.  The entirety was not closed in order to avoid strangulation of the cord.  Careful and adequate hemostasis was then achieved in the dependent hemiscrotum as well as along the edges.  The right testicle is replaced back into the right hemiscrotum in the  appropriate anatomic position.  The wound was copiously irrigated and dried.  Dartos was then closed using a running 3-0 Vicryl suture and further closed using 4-0 chromic in a simple interrupted fashion.  Dermabond was applied.  Scrotal fluffs and a scrotal support device were also applied.  The patient was then reversed from anesthesia and taken to the PACU in stable condition.  Plan: He will return in 4 weeks for wound  check.  Hollice Espy, M.D.

## 2019-08-15 NOTE — Anesthesia Preprocedure Evaluation (Signed)
Anesthesia Evaluation  Patient identified by MRN, date of birth, ID band Patient awake    Reviewed: Allergy & Precautions, NPO status , Patient's Chart, lab work & pertinent test results  History of Anesthesia Complications Negative for: history of anesthetic complications  Airway Mallampati: II  TM Distance: >3 FB Neck ROM: Full    Dental  (+) Poor Dentition, Missing   Pulmonary sleep apnea , COPD,  COPD inhaler,    breath sounds clear to auscultation- rhonchi (-) wheezing      Cardiovascular hypertension, Pt. on medications (-) CAD, (-) Past MI, (-) Cardiac Stents and (-) CABG  Rhythm:Regular Rate:Normal - Systolic murmurs and - Diastolic murmurs    Neuro/Psych neg Seizures negative neurological ROS  negative psych ROS   GI/Hepatic negative GI ROS, Neg liver ROS,   Endo/Other  negative endocrine ROSneg diabetes  Renal/GU negative Renal ROS     Musculoskeletal  (+) Arthritis ,   Abdominal (+) + obese,   Peds  Hematology  (+) anemia ,   Anesthesia Other Findings Past Medical History: No date: Cataract No date: COPD (chronic obstructive pulmonary disease) (HCC) No date: DJD (degenerative joint disease) No date: Elevated PSA No date: Erectile dysfunction No date: Hypertension No date: Neutropenia (HCC) No date: Obesity No date: Obesity No date: Pneumonia No date: POAG (primary open-angle glaucoma) No date: Pseudophakia of both eyes No date: Sickle cell trait (HCC) No date: Sleep apnea No date: Vitamin B 12 deficiency   Reproductive/Obstetrics                             Anesthesia Physical Anesthesia Plan  ASA: III  Anesthesia Plan: General   Post-op Pain Management:    Induction: Intravenous  PONV Risk Score and Plan: 1 and Ondansetron and Dexamethasone  Airway Management Planned: LMA  Additional Equipment:   Intra-op Plan:   Post-operative Plan:   Informed  Consent: I have reviewed the patients History and Physical, chart, labs and discussed the procedure including the risks, benefits and alternatives for the proposed anesthesia with the patient or authorized representative who has indicated his/her understanding and acceptance.     Dental advisory given  Plan Discussed with: CRNA and Anesthesiologist  Anesthesia Plan Comments:         Anesthesia Quick Evaluation

## 2019-08-15 NOTE — Anesthesia Postprocedure Evaluation (Signed)
Anesthesia Post Note  Patient: Kirk Wright  Procedure(s) Performed: HYDROCELECTOMY ADULT (Right Scrotum)  Patient location during evaluation: PACU Anesthesia Type: General Level of consciousness: awake and alert Pain management: pain level controlled Vital Signs Assessment: post-procedure vital signs reviewed and stable Respiratory status: spontaneous breathing and respiratory function stable Cardiovascular status: stable Anesthetic complications: no     Last Vitals:  Vitals:   08/15/19 1841 08/15/19 1855  BP: (!) 148/89 137/84  Pulse: 75 81  Resp: 18 20  Temp:  36.6 C  SpO2: 100% 99%    Last Pain:  Vitals:   08/15/19 1855  TempSrc:   PainSc: 3                  Gavino Fouch K

## 2019-08-15 NOTE — Discharge Instructions (Signed)
Hydrocele, Adult  A hydrocele is a collection of fluid in the loose pouch of skin that holds the testicles (scrotum). This may happen because:  The amount of fluid produced in the scrotum is not absorbed by the rest of the body.  Fluid from the abdomen fills the scrotum. Normally, the testicles develop in the abdomen then move (drop) into to the scrotum before birth. The tube that the testicles travel through usually closes after the testicles drop. If the tube does not close, fluid from the abdomen can fill the scrotum. This is less common in adults.  What are the causes?  The cause of a hydrocele in adults is usually not known. However, it may be caused by:  An injury to the scrotum.  An infection (epididymitis).  Decreased blood flow to the scrotum.  Twisting of a testicle (testicular torsion).  A birth defect.  A tumor or cancer of the testicle.  What are the signs or symptoms?  A hydrocele feels like a water-filled balloon. It may also feel heavy. Other symptoms include:  Swelling of the scrotum. The swelling may decrease when you lie down. You may also notice more swelling at night than in the morning.  Swelling of the groin.  Mild discomfort in the scrotum.  Pain. This can develop if the hydrocele was caused by infection or twisting. The larger the hydrocele, the more likely you are to have pain.  How is this diagnosed?  This condition may be diagnosed based on:  Physical exam.  Medical history.  You may also have other tests, including:  Imaging tests, such as ultrasound.  Blood or urine tests.  How is this treated?  Most hydroceles go away on their own. If you have no discomfort or pain, your health care provider may suggest close monitoring of your condition (called watch and wait or watchful waiting) until the condition goes away or symptoms develop. If treatment is needed, it may include:  Treating an underlying condition. This may include using an antibiotic medicine to treat an infection.  Surgery to  stop fluid from collecting in the scrotum.  Surgery to drain the fluid. Options include:  Needle aspiration. A needle is used to drain fluid. However, the fluid buildup will come back quickly.  Hydrocelectomy. For this procedure, an incision is made in the scrotum to remove the fluid sac.  Follow these instructions at home:  Watch the hydrocele for any changes.  Take over-the-counter and prescription medicines only as told by your health care provider.  If you were prescribed an antibiotic medicine, use it as told by your health care provider. Do not stop taking the antibiotic even if you start to feel better.  Keep all follow-up visits as told by your health care provider. This is important.  Contact a health care provider if:  You notice any changes in the hydrocele.  The swelling in your scrotum or groin gets worse.  The hydrocele becomes red, firm, painful, or tender to the touch.  You have a fever.  Get help right away if you:  Develop a lot of pain, or your pain becomes worse.  Summary  A hydrocele is a collection of fluid in the loose pouch of skin that holds the testicles (scrotum).  Hydroceles can cause swelling, discomfort, and sometimes pain.  In adults, the cause of a hydrocele usually is not known. However, it is sometimes caused by an infection or a rotation and twisting of the scrotum.  Treatment is usually not   may be given to ease the pain. This information is not intended to replace advice given to you by your health care provider. Make sure you discuss any questions you have with your health care provider. Document Revised: 06/20/2017 Document Reviewed: 06/20/2017 Elsevier Patient Education  [employer].   Current Urology, 10(1), 1-14. https://doi.[email or web address]/[email or web address]">  Hydrocelectomy, Adult, Care After This sheet gives you  information about how to care for yourself after your procedure. Your health care provider may also give you more specific instructions. If you have problems or questions, contact your health care provider. What can I expect after the procedure? After your procedure, it is common to have:  Mild discomfort and swelling in the pouch that holds your testicles (scrotum).  Bruising of the scrotum. Follow these instructions at home: Medicines  Take over-the-counter and prescription medicines only as told by your health care provider.  Ask your health care provider if the medicine prescribed to you: ? Requires you to avoid driving or using heavy machinery. ? Can cause constipation. You may need to take these actions to prevent or treat constipation:  Drink enough fluid to keep your urine pale yellow.  Take over-the-counter or prescription medicines.  Eat foods that are high in fiber, such as beans, whole grains, and fresh fruits and vegetables.  Limit foods that are high in fat and processed sugars, such as fried or sweet foods. Bathing  Do not take baths, swim, or use a hot tub until your health care provider approves. Ask your health care provider if you may take showers. You may only be allowed to take sponge baths.  If you were told to wear an athletic support strap (scrotal support), keep it dry. Take it off when you shower or bathe. Incision care   Follow instructions from your health care provider about how to take care of your incision. Make sure you: ? Wash your hands with soap and water before and after you change your bandage (dressing). If soap and water are not available, use hand sanitizer. ? Change your dressing as told by your health care provider. ? Leave stitches (sutures), skin glue, or adhesive strips in place. These skin closures may need to stay in place for 2 weeks or longer. If adhesive strip edges start to loosen and curl up, you may trim the loose edges. Do not  remove adhesive strips completely unless your health care provider tells you to do that.  Check your incision and scrotum every day for signs of infection. Check for: ? More redness, swelling, or pain. ? Fluid or blood. ? Warmth. ? Pus or a bad smell. Managing pain and swelling If directed, put ice on the affected area. To do this:  Put ice in a plastic bag.  Place a towel between your skin and the bag.  Leave the ice on for 20 minutes, 2-3 times per day.  Activity  Do not do any high-energy activities for as long as told by your health care provider.  Do not lift anything that is heavier than 10 lb (4.5 kg), or the limit that you are told, until your health care provider says that it is safe.  Return to your normal activities as told by your health care provider. Ask your health care provider what activities are safe for you.  Do not drive for 24 hours if you were given a sedative during your procedure.  Ask your health care provider when it is safe to drive. General instructions  Do not  use any products that contain nicotine or tobacco, such as cigarettes, e-cigarettes, and chewing tobacco. These can delay incision healing after surgery. If you need help quitting, ask your health care provider.  If you were given a scrotal support, wear it as told by your health care provider.  If you had a drain put in during the procedure, you will need to have it removed at a follow-up visit.  Keep all follow-up visits as told by your health care provider. This is important. Contact a health care provider if:  Your pain is not controlled with medicine.  You have more redness, swelling, or pain around your scrotum.  You have fluid or blood coming from your incision.  Your incision feels warm to the touch.  You have pus or a bad smell coming from your scrotum.  You have a fever. Get help right away if:  You develop shaking, chills, and a fever that is higher than 101.25F  (38.8C).  You have redness or swelling that starts at your scrotum and spreads outward to cover your whole groin.  You develop swelling of the legs or difficulty breathing. Summary  After a hydrocelectomy, it is common to have mild discomfort, swelling, and bruising.  Do not take baths, swim, or use a hot tub until your health care provider approves. Ask your health care provider if you may take showers.  If directed, put ice on the affected area to help with pain and swelling.  Do not do any high-energy activities or lift anything heavier than 10 lb (4.5 kg) for as long as told by your health care provider.  If you were given a scrotal support, keep it dry. Wear the scrotal support as told by your health care provider. This information is not intended to replace advice given to you by your health care provider. Make sure you discuss any questions you have with your health care provider. Document Revised: 11/02/2018 Document Reviewed: 11/02/2018 Elsevier Patient Education  2020 [employer]   1) The drugs that you were given will stay in your system until tomorrow so for the next 24 hours you should not:  A) Drive an automobile B) Make any legal decisions C) Drink any alcoholic beverage   2) You may resume regular meals tomorrow.  Today it is better to start with liquids and gradually work up to solid foods.  You may eat anything you prefer, but it is better to start with liquids, then soup and crackers, and gradually work up to solid foods.   3) Please notify your doctor immediately if you have any unusual bleeding, trouble breathing, redness and pain at the surgery site, drainage, fever, or pain not relieved by medication.    4) Additional Instructions:     Please contact your physician with any problems or Same Day Surgery at [phone], Monday through Friday 6 am to 4 pm, or [place] at [facility] number at  [phone].

## 2019-08-16 ENCOUNTER — Telehealth: Payer: Self-pay | Admitting: Urology

## 2019-08-16 NOTE — Telephone Encounter (Signed)
Patient states he needs to make a post op appointment. When should this be? Also he states he was given no post op instructions other than to keep dressing dry. He is afraid to change the dressing because he wasn't given supplies. Please return patients call to [phone]

## 2019-08-16 NOTE — Telephone Encounter (Signed)
Patient called the office this morning and left a voice mail message.  He had surgery with Dr. Erlene Quan yesterday and has a few questions.  Please call him to discuss.

## 2019-08-16 NOTE — Telephone Encounter (Signed)
Spoke with patient, scheduled post op appointment, gave post op care instructions.

## 2019-08-24 DIAGNOSIS — H401133 Primary open-angle glaucoma, bilateral, severe stage: Secondary | ICD-10-CM | POA: Diagnosis not present

## 2019-09-05 ENCOUNTER — Encounter: Payer: Self-pay | Admitting: *Deleted

## 2019-09-08 NOTE — Progress Notes (Incomplete)
09/09/19 10:03 PM   Kirk Wright 04-04-1946 PO:[id number]  Referring provider: [name], NP [address] [place],  [place] [address]  No chief complaint on file.   HPI: Kirk Wright is a 74 y.o. with a hx of multiple GU issues including prostate cancer, BPH and ED returns today for f/u s/p hydrocelectomy.   Very low risk prostate cancer Patient underwent a prostate biopsy on 11/14/2012 with Dr. [name] for a PSA of 6.86. 58 gram prostate. Results were Gleason 3+3 cancer in 2/12 cores. 2% core of lateral mid and 8% of lateral apex. He was then referred to Dr. [name] on 01/25/2013 for possible robotic prostatectomy.He elected to defer this. He had a repeat prostate biopsy in May 2018 which was negative for disease.Patient chose to manage his prostate cancer with active surveillance.  Most recent imaging in the form of prostate MRI completed in 12/2017 showed no suspicious or high-grade lesions, PI-RADS 1.  Repeat MR 01/27/19 stable, no high grade lesions.  Markedly enlarged transition zone, prostomegaly 150 cc.  Most recent SPA down to 9.84 on 07/22/19.    Right hydrocele S/p hydrocelectomy 08/15/19, interoperative finding of 200 cc straw colored fluid in hydrocele sac.   Erectile dysfunction Severe erectile dysfunction managed with Trimix although not currently using it. He also uses a penile ring to help the erection last longer. He is considering a new device that he saw online. He briefly considered implant but has elected to continue pharmacological management.  BPH with obstruction He does also have a personal history of BPH. Prostate size estimated to be 78 nonincluding median lobe, 135 including median lobe. He has been on Flomax which is helped significantly with his urinary symptoms. He is pleased with his voiding symptoms today. He feels like he has a good stream and able to empty his bladder completely.   No new urinary complaints,  symptoms stable and well controlled.  He does mention today that if he forgets a dose or 2, he does have worsening issues.  1. Right hydrocele S/p hydrocelectomy 08/15/19, interoperative finding of 200 cc straw colored fluid in hydrocele sac.  2. *** *** 3. *** ***   PMH: Past Medical History:  Diagnosis Date  . Cataract   . COPD (chronic obstructive pulmonary disease) ([place])   . DJD (degenerative joint disease)   . Elevated PSA   . Erectile dysfunction   . Hypertension   . Neutropenia ([place])   . Obesity   . Obesity   . Pneumonia   . POAG (primary open-angle glaucoma)   . Pseudophakia of both eyes   . Sickle cell trait ([place])   . Sleep apnea   . Vitamin B 12 deficiency     Surgical History: Past Surgical History:  Procedure Laterality Date  . COLONOSCOPY WITH PROPOFOL N/A 02/23/2015   Procedure: COLONOSCOPY WITH PROPOFOL;  Surgeon: [name], MD;  Location: [facility] ENDOSCOPY;  Service: Endoscopy;  Laterality: N/A;  . EYE SURGERY    . GLAUCOMA SURGERY    . HERNIA REPAIR    . HYDROCELE EXCISION Right 08/15/2019   Procedure: HYDROCELECTOMY ADULT;  Surgeon: Hollice Espy, MD;  Location: [facility] ORS;  Service: Urology;  Laterality: Right;  . PROSTRATE BX      Home Medications:  Allergies as of 09/09/2019      Reactions   Contrast Media [iodinated Diagnostic Agents] Rash      Medication List       Accurate as of  September 08, 2019 10:03 PM. If you have any questions, ask your nurse or doctor.        aspirin 81 MG tablet Take 81 mg by mouth daily.   bimatoprost 0.03 % ophthalmic solution Commonly known as: LUMIGAN Place 1 drop into both eyes at bedtime.   brimonidine-timolol 0.2-0.5 % ophthalmic solution Commonly known as: COMBIGAN Place 1 drop into both eyes every 12 (twelve) hours.   budesonide-formoterol 160-4.5 MCG/ACT inhaler Commonly known as: SYMBICORT Inhale 2 puffs into the lungs in the morning and at bedtime.   docusate sodium 100 MG  capsule Commonly known as: COLACE Take 1 capsule (100 mg total) by mouth 2 (two) times daily.   ferrous sulfate 325 (65 FE) MG tablet Take 325 mg by mouth daily with breakfast.   HYDROcodone-acetaminophen 5-325 MG tablet Commonly known as: NORCO/VICODIN Take 1-2 tablets by mouth every 6 (six) hours as needed for moderate pain.   ibuprofen 200 MG tablet Commonly known as: ADVIL Take 400 mg by mouth every 6 (six) hours as needed (for pain.).   lisinopril-hydrochlorothiazide 20-25 MG tablet Commonly known as: ZESTORETIC Take 1 tablet by mouth daily.   ProAir HFA 108 (90 Base) MCG/ACT inhaler Generic drug: albuterol Inhale 2 puffs into the lungs every 6 (six) hours as needed for wheezing or shortness of breath.   Systane Balance 0.6 % Soln Generic drug: Propylene Glycol Place 1 drop into both eyes 3 (three) times daily as needed (dry/irriated eyes.).   tamsulosin 0.4 MG Caps capsule Commonly known as: FLOMAX Take 0.4 mg by mouth daily.   Vitamin B-12 5000 MCG Tbdp Take 5,000 mcg by mouth daily.       Allergies:  Allergies  Allergen Reactions  . Contrast Media [Iodinated Diagnostic Agents] Rash    Family History: Family History  Problem Relation Age of Onset  . Kidney cancer Neg Hx   . Kidney disease Neg Hx   . Prostate cancer Neg Hx   . Bladder Cancer Neg Hx     Social History:  reports that he is a non-smoker but has been exposed to tobacco smoke. He has never used smokeless tobacco. He reports that he does not drink alcohol or use drugs.   Physical Exam: There were no vitals taken for this visit.  Constitutional:  Alert and oriented, No acute distress. HEENT: [place] AT, moist mucus membranes.  Trachea midline, no masses. Cardiovascular: No clubbing, cyanosis, or edema. Respiratory: Normal respiratory effort, no increased work of breathing. GI: Abdomen is soft, nontender, nondistended, no abdominal masses GU: No CVA tenderness Lymph: No cervical or inguinal  lymphadenopathy. Skin: No rashes, bruises or suspicious lesions. Neurologic: Grossly intact, no focal deficits, moving all 4 extremities. Psychiatric: Normal mood and affect.  Laboratory Data:  Urinalysis  Pertinent Imaging: *  Assessment & Plan:    @DIAGMED @  No follow-ups on file.  [facility] [address] [place], [place] [address] [phone]  I, Lucas Mallow, am acting as a scribe for Dr. Hollice Espy,  {Add Scribe Attestation Statement}

## 2019-09-09 ENCOUNTER — Ambulatory Visit (INDEPENDENT_AMBULATORY_CARE_PROVIDER_SITE_OTHER): Payer: PPO | Admitting: Urology

## 2019-09-09 ENCOUNTER — Ambulatory Visit: Payer: Self-pay | Admitting: Urology

## 2019-09-09 ENCOUNTER — Other Ambulatory Visit: Payer: Self-pay

## 2019-09-09 VITALS — BP 135/75 | HR 60 | Ht 72.0 in | Wt 225.0 lb

## 2019-09-09 DIAGNOSIS — N433 Hydrocele, unspecified: Secondary | ICD-10-CM

## 2019-09-09 NOTE — Progress Notes (Signed)
09/09/19 1:16 PM   Kirk Wright Jun 19, 1946 [id number]  Referring provider: [name], NP [address] [place],  [place] [address]  No chief complaint on file.   HPI: Kirk Wright is a 74 y.o. African American M with multiple GU issues including prostate, cancer and ED returns today for a 4 week f/u s/p hydrocelectomy.   Very low risk prostate cancer Patient underwent a prostate biopsy on 11/14/2012 with Dr. [name] for a PSA of 6.86. 58 gram prostate. Results were Gleason 3+3 cancer in 2/12 cores. 2% core of lateral mid and 8% of lateral apex. He was then referred to Dr. [name] on 01/25/2013 for possible robotic prostatectomy.He elected to defer this. He had a repeat prostate biopsy in May 2018 which was negative for disease.Patient chose to manage his prostate cancer with active surveillance.  Most recent imaging in the form of prostate MRI completed in 12/2017 showed no suspicious or high-grade lesions, PI-RADS 1.  Repeat MR 01/27/19 stable, no high grade lesions.  Markedly enlarged transition zone, prostomegaly 150 cc.  Most recent SPA down to 9.84 on 07/22/19.    Right hydrocele  S/p hydrocelectomy on 07/29/19.  He reports of a soft ball size hydrocele still being present however feels more firmer. He also reports of not being as bothered by the size as he used to be.  His incision has healed well.  PMH: Past Medical History:  Diagnosis Date  . Cataract   . COPD (chronic obstructive pulmonary disease) ([place])   . DJD (degenerative joint disease)   . Elevated PSA   . Erectile dysfunction   . Hypertension   . Neutropenia ([place])   . Obesity   . Obesity   . Pneumonia   . POAG (primary open-angle glaucoma)   . Pseudophakia of both eyes   . Sickle cell trait ([place])   . Sleep apnea   . Vitamin B 12 deficiency     Surgical History: Past Surgical History:  Procedure Laterality Date  . COLONOSCOPY WITH PROPOFOL N/A 02/23/2015   Procedure: COLONOSCOPY WITH PROPOFOL;  Surgeon: [name], MD;  Location: [facility] ENDOSCOPY;  Service: Endoscopy;  Laterality: N/A;  . EYE SURGERY    . GLAUCOMA SURGERY    . HERNIA REPAIR    . HYDROCELE EXCISION Right 08/15/2019   Procedure: HYDROCELECTOMY ADULT;  Surgeon: Hollice Espy, MD;  Location: [facility] ORS;  Service: Urology;  Laterality: Right;  . PROSTRATE BX      Home Medications:  Allergies as of 09/09/2019      Reactions   Contrast Media [iodinated Diagnostic Agents] Rash      Medication List       Accurate as of September 09, 2019  1:16 PM. If you have any questions, ask your nurse or doctor.        aspirin 81 MG tablet Take 81 mg by mouth daily.   bimatoprost 0.03 % ophthalmic solution Commonly known as: LUMIGAN Place 1 drop into both eyes at bedtime.   brimonidine-timolol 0.2-0.5 % ophthalmic solution Commonly known as: COMBIGAN Place 1 drop into both eyes every 12 (twelve) hours.   budesonide-formoterol 160-4.5 MCG/ACT inhaler Commonly known as: SYMBICORT Inhale 2 puffs into the lungs in the morning and at bedtime.   docusate sodium 100 MG capsule Commonly known as: COLACE Take 1 capsule (100 mg total) by mouth 2 (two) times daily.   ferrous sulfate 325 (65 FE) MG tablet Take 325 mg by mouth daily with breakfast.  HYDROcodone-acetaminophen 5-325 MG tablet Commonly known as: NORCO/VICODIN Take 1-2 tablets by mouth every 6 (six) hours as needed for moderate pain.   ibuprofen 200 MG tablet Commonly known as: ADVIL Take 400 mg by mouth every 6 (six) hours as needed (for pain.).   lisinopril-hydrochlorothiazide 20-25 MG tablet Commonly known as: ZESTORETIC Take 1 tablet by mouth daily.   ProAir HFA 108 (90 Base) MCG/ACT inhaler Generic drug: albuterol Inhale 2 puffs into the lungs every 6 (six) hours as needed for wheezing or shortness of breath.   Systane Balance 0.6 % Soln Generic drug: Propylene Glycol Place 1 drop into both eyes 3 (three)  times daily as needed (dry/irriated eyes.).   tamsulosin 0.4 MG Caps capsule Commonly known as: FLOMAX Take 0.4 mg by mouth daily.   Vitamin B-12 5000 MCG Tbdp Take 5,000 mcg by mouth daily.       Allergies:  Allergies  Allergen Reactions  . Contrast Media [Iodinated Diagnostic Agents] Rash    Family History: Family History  Problem Relation Age of Onset  . Kidney cancer Neg Hx   . Kidney disease Neg Hx   . Prostate cancer Neg Hx   . Bladder Cancer Neg Hx     Social History:  reports that he is a non-smoker but has been exposed to tobacco smoke. He has never used smokeless tobacco. He reports that he does not drink alcohol or use drugs.   Physical Exam: There were no vitals taken for this visit.  Constitutional:  Alert and oriented, No acute distress. HEENT: [place] AT, moist mucus membranes.  Trachea midline, no masses. Cardiovascular: No clubbing, cyanosis, or edema. Respiratory: Normal respiratory effort, no increased work of breathing. GU: No CVA tenderness. Testicle non palpable on right with no scortal changes, softball size or bigger, incision well healed. Skin: No rashes, bruises or suspicious lesions. Neurologic: Grossly intact, no focal deficits, moving all 4 extremities. Psychiatric: Normal mood and affect.  Laboratory Data:  Lab Results  Component Value Date   CREATININE 1.61 (H) 08/11/2019   Assessment & Plan:    1. Prostate cancer [facility]) Very low risk prostate cancer on active surveillance PSA trending up slightly however patient does have massive prostamegaly, will continue to follow every 6 month basis Most recent prostate MRI was reassuring F/u in August 2021 with PSA   2. BPH with urinary obstruction Symptoms well controlled on Flomax Would strongly recommend consideration of an outlet procedure in the form of holmium laser enucleation of the prostate should he ever want to stop medication, will consider this but not yet willing or ready to  proceed at this time  3. Right hydrocele S/p hydrocelectomy 07/29/19 Initially with some improvement in size but fairly quick reaccumulation/size increase but decreased bother Unclear whether postoperative swelling/hematoma vs reoccurrence  Continue supportive care    F/u in 6 weeks for recheck and consider Scrotal US at that time   [facility] [address], [place], [place] [address] [phone]  I, Lucas Mallow, am acting as a scribe for Dr. Hollice Espy,  .I have reviewed the above documentation for accuracy and completeness, and I agree with the above.   Hollice Espy, MD

## 2019-09-14 ENCOUNTER — Ambulatory Visit: Payer: Self-pay | Admitting: Urology

## 2019-10-06 ENCOUNTER — Other Ambulatory Visit: Payer: Self-pay | Admitting: Orthopedic Surgery

## 2019-10-06 DIAGNOSIS — M25311 Other instability, right shoulder: Secondary | ICD-10-CM | POA: Diagnosis not present

## 2019-10-06 DIAGNOSIS — M25511 Pain in right shoulder: Secondary | ICD-10-CM

## 2019-10-06 DIAGNOSIS — E669 Obesity, unspecified: Secondary | ICD-10-CM | POA: Diagnosis not present

## 2019-10-06 DIAGNOSIS — S46001A Unspecified injury of muscle(s) and tendon(s) of the rotator cuff of right shoulder, initial encounter: Secondary | ICD-10-CM | POA: Diagnosis not present

## 2019-10-20 ENCOUNTER — Other Ambulatory Visit: Payer: Self-pay

## 2019-10-20 ENCOUNTER — Ambulatory Visit
Admission: RE | Admit: 2019-10-20 | Discharge: 2019-10-20 | Disposition: A | Discharge status: Home / Self Care | Payer: PPO | Source: Ambulatory Visit | Attending: Orthopedic Surgery | Admitting: Orthopedic Surgery

## 2019-10-20 DIAGNOSIS — M25511 Pain in right shoulder: Secondary | ICD-10-CM | POA: Insufficient documentation

## 2019-10-20 DIAGNOSIS — S46001A Unspecified injury of muscle(s) and tendon(s) of the rotator cuff of right shoulder, initial encounter: Secondary | ICD-10-CM | POA: Insufficient documentation

## 2019-10-20 DIAGNOSIS — M75121 Complete rotator cuff tear or rupture of right shoulder, not specified as traumatic: Secondary | ICD-10-CM | POA: Diagnosis not present

## 2019-10-20 DIAGNOSIS — S46011A Strain of muscle(s) and tendon(s) of the rotator cuff of right shoulder, initial encounter: Secondary | ICD-10-CM | POA: Diagnosis not present

## 2019-10-20 NOTE — Progress Notes (Incomplete)
10/21/19 11:14 PM   Kirk Wright 05-Jun-1946 [id number]  Referring provider: [name], NP [address] [place],  [place] [address] No chief complaint on file.   HPI: Kirk Wright is a 74 y.o. M with multiple GU issues including prostate, cancer and ED returns today for a f/u.   Very low risk prostate cancer Patient underwent a prostate biopsy on 11/14/2012 with Dr. [name] for a PSA of 6.86. 58 gram prostate. Results were Gleason 3+3 cancer in 2/12 cores. 2% core of lateral mid and 8% of lateral apex. He was then referred to Dr. [name] on 01/25/2013 for possible robotic prostatectomy.He elected to defer this. He had a repeat prostate biopsy in May 2018 which was negative for disease.Patient chose to manage his prostate cancer with active surveillance.  Most recent imaging in the form of prostate MRI completed in 12/2017 showed no suspicious or high-grade lesions, PI-RADS 1.  Repeat MR 01/27/19 stable, no high grade lesions. Markedly enlarged transition zone, prostomegaly 150 cc.  Most recent PSA down to 9.84 on 07/22/19.   Right hydrocele  S/p hydrocelectomy on 07/29/19.  He reports of a soft ball size hydrocele still being present however feels more firmer. He also reports of not being as bothered by the size as he used to be.  His incision has healed well.   PMH: Past Medical History:  Diagnosis Date  . Cataract   . COPD (chronic obstructive pulmonary disease) ([place])   . DJD (degenerative joint disease)   . Elevated PSA   . Erectile dysfunction   . Hypertension   . Neutropenia ([place])   . Obesity   . Obesity   . Pneumonia   . POAG (primary open-angle glaucoma)   . Pseudophakia of both eyes   . Sickle cell trait ([place])   . Sleep apnea   . Vitamin B 12 deficiency     Surgical History: Past Surgical History:  Procedure Laterality Date  . COLONOSCOPY WITH PROPOFOL N/A 02/23/2015   Procedure: COLONOSCOPY WITH PROPOFOL;  Surgeon:  [name], MD;  Location: [facility] ENDOSCOPY;  Service: Endoscopy;  Laterality: N/A;  . EYE SURGERY    . GLAUCOMA SURGERY    . HERNIA REPAIR    . HYDROCELE EXCISION Right 08/15/2019   Procedure: HYDROCELECTOMY ADULT;  Surgeon: Hollice Espy, MD;  Location: [facility] ORS;  Service: Urology;  Laterality: Right;  . PROSTRATE BX      Home Medications:  Allergies as of 10/21/2019      Reactions   Contrast Media [iodinated Diagnostic Agents] Rash      Medication List       Accurate as of October 20, 2019 11:14 PM. If you have any questions, ask your nurse or doctor.        aspirin 81 MG tablet Take 81 mg by mouth daily.   bimatoprost 0.03 % ophthalmic solution Commonly known as: LUMIGAN Place 1 drop into both eyes at bedtime.   brimonidine-timolol 0.2-0.5 % ophthalmic solution Commonly known as: COMBIGAN Place 1 drop into both eyes every 12 (twelve) hours.   budesonide-formoterol 160-4.5 MCG/ACT inhaler Commonly known as: SYMBICORT Inhale 2 puffs into the lungs in the morning and at bedtime.   docusate sodium 100 MG capsule Commonly known as: COLACE Take 1 capsule (100 mg total) by mouth 2 (two) times daily.   ferrous sulfate 325 (65 FE) MG tablet Take 325 mg by mouth daily with breakfast.   HYDROcodone-acetaminophen 5-325 MG tablet Commonly known  as: NORCO/VICODIN Take 1-2 tablets by mouth every 6 (six) hours as needed for moderate pain.   ibuprofen 200 MG tablet Commonly known as: ADVIL Take 400 mg by mouth every 6 (six) hours as needed (for pain.).   lisinopril-hydrochlorothiazide 20-25 MG tablet Commonly known as: ZESTORETIC Take 1 tablet by mouth daily.   ProAir HFA 108 (90 Base) MCG/ACT inhaler Generic drug: albuterol Inhale 2 puffs into the lungs every 6 (six) hours as needed for wheezing or shortness of breath.   Systane Balance 0.6 % Soln Generic drug: Propylene Glycol Place 1 drop into both eyes 3 (three) times daily as needed (dry/irriated eyes.).     tamsulosin 0.4 MG Caps capsule Commonly known as: FLOMAX Take 0.4 mg by mouth daily.   Vitamin B-12 5000 MCG Tbdp Take 5,000 mcg by mouth daily.       Allergies:  Allergies  Allergen Reactions  . Contrast Media [Iodinated Diagnostic Agents] Rash    Family History: Family History  Problem Relation Age of Onset  . Kidney cancer Neg Hx   . Kidney disease Neg Hx   . Prostate cancer Neg Hx   . Bladder Cancer Neg Hx     Social History:  reports that he is a non-smoker but has been exposed to tobacco smoke. He has never used smokeless tobacco. He reports that he does not drink alcohol or use drugs.   Physical Exam: There were no vitals taken for this visit.  Constitutional:  Alert and oriented, No acute distress. HEENT: [place] AT, moist mucus membranes.  Trachea midline, no masses. Cardiovascular: No clubbing, cyanosis, or edema. Respiratory: Normal respiratory effort, no increased work of breathing. GI: Abdomen is soft, nontender, nondistended, no abdominal masses GU: No CVA tenderness Lymph: No cervical or inguinal lymphadenopathy. Skin: No rashes, bruises or suspicious lesions. Neurologic: Grossly intact, no focal deficits, moving all 4 extremities. Psychiatric: Normal mood and affect.  Laboratory Data:  Lab Results  Component Value Date   CREATININE 1.61 (H) 08/11/2019    Urinalysis   Pertinent Imaging: ***  Assessment & Plan:     No follow-ups on file.  [facility] [address] [place], [place] [address] [phone]  I, Lucas Mallow, am acting as a scribe for Dr. Hollice Espy,  {Add Scribe Attestation Statement}

## 2019-10-21 ENCOUNTER — Ambulatory Visit: Payer: PPO | Admitting: Urology

## 2019-10-25 DIAGNOSIS — M75121 Complete rotator cuff tear or rupture of right shoulder, not specified as traumatic: Secondary | ICD-10-CM | POA: Diagnosis not present

## 2019-11-15 DIAGNOSIS — H401133 Primary open-angle glaucoma, bilateral, severe stage: Secondary | ICD-10-CM | POA: Diagnosis not present

## 2019-11-18 ENCOUNTER — Other Ambulatory Visit: Payer: Self-pay | Admitting: Orthopedic Surgery

## 2019-11-24 ENCOUNTER — Other Ambulatory Visit: Payer: Self-pay

## 2019-11-24 ENCOUNTER — Encounter: Payer: Self-pay | Admitting: Orthopedic Surgery

## 2019-11-28 ENCOUNTER — Other Ambulatory Visit: Admission: RE | Admit: 2019-11-28 | Payer: PPO | Source: Ambulatory Visit

## 2019-11-29 DIAGNOSIS — M75121 Complete rotator cuff tear or rupture of right shoulder, not specified as traumatic: Secondary | ICD-10-CM | POA: Diagnosis not present

## 2019-11-30 ENCOUNTER — Other Ambulatory Visit
Admission: RE | Admit: 2019-11-30 | Discharge: 2019-11-30 | Disposition: A | Discharge status: Home / Self Care | Payer: PPO | Source: Ambulatory Visit | Attending: Orthopedic Surgery | Admitting: Orthopedic Surgery

## 2019-11-30 ENCOUNTER — Other Ambulatory Visit: Payer: Self-pay

## 2019-11-30 DIAGNOSIS — Z20822 Contact with and (suspected) exposure to covid-19: Secondary | ICD-10-CM | POA: Diagnosis not present

## 2019-11-30 DIAGNOSIS — Z01812 Encounter for preprocedural laboratory examination: Secondary | ICD-10-CM | POA: Diagnosis not present

## 2019-11-30 LAB — SARS CORONAVIRUS 2 (TAT 6-24 HRS): SARS Coronavirus 2: NEGATIVE

## 2019-12-02 ENCOUNTER — Encounter
Admission: RE | Disposition: A | Discharge status: Home / Self Care | Payer: Self-pay | Source: Home / Self Care | Attending: Orthopedic Surgery

## 2019-12-02 ENCOUNTER — Other Ambulatory Visit: Payer: Self-pay

## 2019-12-02 ENCOUNTER — Ambulatory Visit: Payer: PPO | Admitting: Anesthesiology

## 2019-12-02 ENCOUNTER — Encounter: Payer: Self-pay | Admitting: Orthopedic Surgery

## 2019-12-02 ENCOUNTER — Ambulatory Visit
Admission: RE | Admit: 2019-12-02 | Discharge: 2019-12-02 | Disposition: A | Discharge status: Home / Self Care | Payer: PPO | Attending: Orthopedic Surgery | Admitting: Orthopedic Surgery

## 2019-12-02 DIAGNOSIS — G4730 Sleep apnea, unspecified: Secondary | ICD-10-CM | POA: Insufficient documentation

## 2019-12-02 DIAGNOSIS — N400 Enlarged prostate without lower urinary tract symptoms: Secondary | ICD-10-CM | POA: Insufficient documentation

## 2019-12-02 DIAGNOSIS — M75121 Complete rotator cuff tear or rupture of right shoulder, not specified as traumatic: Secondary | ICD-10-CM | POA: Diagnosis not present

## 2019-12-02 DIAGNOSIS — M25511 Pain in right shoulder: Secondary | ICD-10-CM | POA: Diagnosis not present

## 2019-12-02 DIAGNOSIS — S46011A Strain of muscle(s) and tendon(s) of the rotator cuff of right shoulder, initial encounter: Secondary | ICD-10-CM | POA: Insufficient documentation

## 2019-12-02 DIAGNOSIS — M7541 Impingement syndrome of right shoulder: Secondary | ICD-10-CM | POA: Diagnosis not present

## 2019-12-02 DIAGNOSIS — Z7982 Long term (current) use of aspirin: Secondary | ICD-10-CM | POA: Insufficient documentation

## 2019-12-02 DIAGNOSIS — E669 Obesity, unspecified: Secondary | ICD-10-CM | POA: Insufficient documentation

## 2019-12-02 DIAGNOSIS — I10 Essential (primary) hypertension: Secondary | ICD-10-CM | POA: Diagnosis not present

## 2019-12-02 DIAGNOSIS — X500XXA Overexertion from strenuous movement or load, initial encounter: Secondary | ICD-10-CM | POA: Insufficient documentation

## 2019-12-02 DIAGNOSIS — Z6832 Body mass index (BMI) 32.0-32.9, adult: Secondary | ICD-10-CM | POA: Diagnosis not present

## 2019-12-02 DIAGNOSIS — M25811 Other specified joint disorders, right shoulder: Secondary | ICD-10-CM | POA: Insufficient documentation

## 2019-12-02 DIAGNOSIS — Z79899 Other long term (current) drug therapy: Secondary | ICD-10-CM | POA: Insufficient documentation

## 2019-12-02 DIAGNOSIS — M7581 Other shoulder lesions, right shoulder: Secondary | ICD-10-CM | POA: Diagnosis not present

## 2019-12-02 DIAGNOSIS — M75111 Incomplete rotator cuff tear or rupture of right shoulder, not specified as traumatic: Secondary | ICD-10-CM | POA: Diagnosis not present

## 2019-12-02 DIAGNOSIS — Z87891 Personal history of nicotine dependence: Secondary | ICD-10-CM | POA: Diagnosis not present

## 2019-12-02 DIAGNOSIS — M75101 Unspecified rotator cuff tear or rupture of right shoulder, not specified as traumatic: Secondary | ICD-10-CM | POA: Diagnosis present

## 2019-12-02 DIAGNOSIS — Z7951 Long term (current) use of inhaled steroids: Secondary | ICD-10-CM | POA: Insufficient documentation

## 2019-12-02 DIAGNOSIS — G8918 Other acute postprocedural pain: Secondary | ICD-10-CM | POA: Diagnosis not present

## 2019-12-02 DIAGNOSIS — Z8546 Personal history of malignant neoplasm of prostate: Secondary | ICD-10-CM | POA: Diagnosis not present

## 2019-12-02 DIAGNOSIS — J449 Chronic obstructive pulmonary disease, unspecified: Secondary | ICD-10-CM | POA: Insufficient documentation

## 2019-12-02 DIAGNOSIS — M7521 Bicipital tendinitis, right shoulder: Secondary | ICD-10-CM | POA: Diagnosis not present

## 2019-12-02 HISTORY — DX: Anemia, unspecified: D64.9

## 2019-12-02 HISTORY — DX: Presence of dental prosthetic device (complete) (partial): Z97.2

## 2019-12-02 HISTORY — PX: SHOULDER ARTHROSCOPY WITH ROTATOR CUFF REPAIR AND OPEN BICEPS TENODESIS: SHX6677

## 2019-12-02 SURGERY — SHOULDER ARTHROSCOPY WITH ROTATOR CUFF REPAIR AND OPEN BICEPS TENODESIS
Anesthesia: General | Site: Shoulder | Laterality: Right

## 2019-12-02 MED ORDER — ASPIRIN EC 325 MG PO TBEC
325.0000 mg | DELAYED_RELEASE_TABLET | Freq: Every day | ORAL | 0 refills | Status: AC
Start: 2019-12-02 — End: 2019-12-16

## 2019-12-02 MED ORDER — OXYCODONE HCL 5 MG PO TABS: 5.0000 mg | ORAL_TABLET | ORAL | 0 refills | Status: AC | PRN

## 2019-12-02 MED ORDER — MIDAZOLAM HCL 2 MG/2ML IJ SOLN
INTRAMUSCULAR | Status: DC | PRN
  Administered 2019-12-02: 1 mg via INTRAVENOUS

## 2019-12-02 MED ORDER — BUPIVACAINE LIPOSOME 1.3 % IJ SUSP
INTRAMUSCULAR | Status: DC | PRN
  Administered 2019-12-02: 20 mL via PERINEURAL

## 2019-12-02 MED ORDER — ONDANSETRON 4 MG PO TBDP
4.0000 mg | ORAL_TABLET | Freq: Three times a day (TID) | ORAL | 0 refills | Status: DC | PRN
Start: 2019-12-02 — End: 2022-04-11

## 2019-12-02 MED ORDER — BUPIVACAINE HCL (PF) 0.5 % IJ SOLN
INTRAMUSCULAR | Status: DC | PRN
Start: 2019-12-02 — End: 2019-12-02
  Administered 2019-12-02: 20 mL via PERINEURAL

## 2019-12-02 MED ORDER — LACTATED RINGERS IV SOLN
10.0000 mL/h | INTRAVENOUS | Status: DC
  Administered 2019-12-02: 10 mL/h via INTRAVENOUS

## 2019-12-02 MED ORDER — ACETAMINOPHEN 500 MG PO TABS
1000.0000 mg | ORAL_TABLET | Freq: Three times a day (TID) | ORAL | 2 refills | Status: AC
Start: 2019-12-02 — End: 2020-12-01

## 2019-12-02 MED ORDER — CEFAZOLIN SODIUM-DEXTROSE 2-4 GM/100ML-% IV SOLN
2.0000 g | INTRAVENOUS | Status: AC
  Administered 2019-12-02: 2 g via INTRAVENOUS

## 2019-12-02 MED ORDER — LACTATED RINGERS IV SOLN
INTRAVENOUS | Status: DC | PRN
  Administered 2019-12-02: 4 mL

## 2019-12-02 MED ORDER — PROPOFOL 10 MG/ML IV BOLUS
INTRAVENOUS | Status: DC | PRN
  Administered 2019-12-02: 150 mg via INTRAVENOUS

## 2019-12-02 MED ORDER — ONDANSETRON HCL 4 MG/2ML IJ SOLN
4.0000 mg | Freq: Once | INTRAMUSCULAR | Status: AC | PRN
  Administered 2019-12-02: 4 mg via INTRAVENOUS

## 2019-12-02 MED ORDER — LIDOCAINE HCL (CARDIAC) PF 100 MG/5ML IV SOSY
PREFILLED_SYRINGE | INTRAVENOUS | Status: DC | PRN
  Administered 2019-12-02: 50 mg via INTRATRACHEAL

## 2019-12-02 MED ORDER — OXYCODONE HCL 5 MG PO TABS: 5.0000 mg | ORAL_TABLET | Freq: Once | ORAL | Status: DC | PRN

## 2019-12-02 MED ORDER — FENTANYL CITRATE (PF) 100 MCG/2ML IJ SOLN: 25.0000 ug | INTRAMUSCULAR | Status: DC | PRN

## 2019-12-02 MED ORDER — OXYCODONE HCL 5 MG/5ML PO SOLN: 5.0000 mg | Freq: Once | ORAL | Status: DC | PRN

## 2019-12-02 MED ORDER — ACETAMINOPHEN 160 MG/5ML PO SOLN: 325.0000 mg | ORAL | Status: DC | PRN

## 2019-12-02 MED ORDER — ACETAMINOPHEN 325 MG PO TABS: 325.0000 mg | ORAL_TABLET | ORAL | Status: DC | PRN

## 2019-12-02 MED ORDER — GLYCOPYRROLATE 0.2 MG/ML IJ SOLN
INTRAMUSCULAR | Status: DC | PRN
  Administered 2019-12-02: .2 mg via INTRAVENOUS

## 2019-12-02 MED ORDER — FENTANYL CITRATE (PF) 100 MCG/2ML IJ SOLN
INTRAMUSCULAR | Status: DC | PRN
  Administered 2019-12-02: 50 ug via INTRAVENOUS

## 2019-12-02 SURGICAL SUPPLY — 62 items
ADAPTER IRRIG TUBE 2 SPIKE SOL (ADAPTER) ×6 IMPLANT
ANCHOR 2.3 SP SGL 1.2 XBRAID (Anchor) ×2 IMPLANT
ANCHOR ICONIX SPEED 2.3 (Anchor) ×2 IMPLANT
ANCHOR ICONIX SPEED 2.3MM (Anchor) ×1 IMPLANT
ANCHOR SUT BIO SW 4.75X19.1 (Anchor) ×9 IMPLANT
ANCHOR SUT BIOCOMP LK 2.9X12.5 (Anchor) ×3 IMPLANT
BUR BR 5.5 12 FLUTE (BURR) ×3 IMPLANT
BUR RADIUS 4.0X18.5 (BURR) ×3 IMPLANT
CANNULA 5.75X7CM (CANNULA) ×1
CANNULA PART THRD DISP 5.75X7 (CANNULA) ×2 IMPLANT
CANNULA PARTIAL THREAD 2X7 (CANNULA) ×3 IMPLANT
CHLORAPREP W/TINT 26 (MISCELLANEOUS) ×3 IMPLANT
COOLER POLAR GLACIER W/PUMP (MISCELLANEOUS) ×3 IMPLANT
COVER LIGHT HANDLE UNIVERSAL (MISCELLANEOUS) ×6 IMPLANT
DERMABOND ADVANCED (GAUZE/BANDAGES/DRESSINGS) ×2
DERMABOND ADVANCED .7 DNX12 (GAUZE/BANDAGES/DRESSINGS) ×1 IMPLANT
DRAPE IMP U-DRAPE 54X76 (DRAPES) ×6 IMPLANT
DRAPE INCISE IOBAN 66X45 STRL (DRAPES) ×3 IMPLANT
DRAPE SHEET LG 3/4 BI-LAMINATE (DRAPES) ×3 IMPLANT
DRAPE U-SHAPE 48X52 POLY STRL (PACKS) ×6 IMPLANT
ELECT REM PT RETURN 9FT ADLT (ELECTROSURGICAL) ×3
ELECTRODE REM PT RTRN 9FT ADLT (ELECTROSURGICAL) ×1 IMPLANT
GAUZE SPONGE 4X4 12PLY STRL (GAUZE/BANDAGES/DRESSINGS) ×3 IMPLANT
GAUZE XEROFORM 1X8 LF (GAUZE/BANDAGES/DRESSINGS) ×3 IMPLANT
GLOVE BIOGEL PI IND STRL 8 (GLOVE) ×1 IMPLANT
GLOVE BIOGEL PI INDICATOR 8 (GLOVE) ×2
GOWN STRL REIN 2XL XLG LVL4 (GOWN DISPOSABLE) ×3 IMPLANT
GOWN STRL REUS W/ TWL LRG LVL3 (GOWN DISPOSABLE) ×1 IMPLANT
GOWN STRL REUS W/TWL LRG LVL3 (GOWN DISPOSABLE) ×2
IV LACTATED RINGER IRRG 3000ML (IV SOLUTION) ×30
IV LR IRRIG 3000ML ARTHROMATIC (IV SOLUTION) ×15 IMPLANT
KIT INSERTION 2.9 PUSHLOCK (KITS) ×3 IMPLANT
KIT STABILIZATION SHOULDER (MISCELLANEOUS) ×3 IMPLANT
KIT TURNOVER KIT A (KITS) ×3 IMPLANT
MANIFOLD 4PT FOR NEPTUNE1 (MISCELLANEOUS) ×3 IMPLANT
MASK FACE SPIDER DISP (MASK) ×3 IMPLANT
MAT ABSORB  FLUID 56X50 GRAY (MISCELLANEOUS) ×4
MAT ABSORB FLUID 56X50 GRAY (MISCELLANEOUS) ×2 IMPLANT
NDL SAFETY ECLIPSE 18X1.5 (NEEDLE) ×1 IMPLANT
NEEDLE HYPO 18GX1.5 SHARP (NEEDLE) ×2
NEEDLE SCORPION MULTI FIRE (NEEDLE) ×3 IMPLANT
PACK ARTHROSCOPY SHOULDER (MISCELLANEOUS) ×3 IMPLANT
PAD WRAPON POLAR SHDR UNIV (MISCELLANEOUS) ×1 IMPLANT
PASSER SUT SWIFTSTITCH HIP CRT (INSTRUMENTS) ×3 IMPLANT
PENCIL SMOKE EVACUATOR (MISCELLANEOUS) ×3 IMPLANT
SET TUBE SUCT SHAVER OUTFL 24K (TUBING) ×3 IMPLANT
SET TUBE TIP INTRA-ARTICULAR (MISCELLANEOUS) ×3 IMPLANT
SUT ETHILON 3-0 (SUTURE) ×3 IMPLANT
SUT ETHILON 3-0 FS-10 30 BLK (SUTURE) ×3
SUT MNCRL 4-0 (SUTURE) ×2
SUT MNCRL 4-0 27XMFL (SUTURE) ×1
SUT VIC AB 0 CT1 36 (SUTURE) ×3 IMPLANT
SUT VIC AB 2-0 CT2 27 (SUTURE) ×3 IMPLANT
SUTURE EHLN 3-0 FS-10 30 BLK (SUTURE) ×1 IMPLANT
SUTURE MNCRL 4-0 27XMF (SUTURE) ×1 IMPLANT
SYR 10ML LL (SYRINGE) ×3 IMPLANT
TAPE MICROFOAM 4IN (TAPE) ×3 IMPLANT
TUBING ARTHRO INFLOW-ONLY STRL (TUBING) ×3 IMPLANT
TUBING CONNECTING 10 (TUBING) ×2 IMPLANT
TUBING CONNECTING 10' (TUBING) ×1
WAND WEREWOLF FLOW 90D (MISCELLANEOUS) ×3 IMPLANT
WRAPON POLAR PAD SHDR UNIV (MISCELLANEOUS) ×3

## 2019-12-02 NOTE — Anesthesia Preprocedure Evaluation (Signed)
Anesthesia Evaluation  Patient identified by MRN, date of birth, ID band Patient awake    Reviewed: Allergy & Precautions, H&P , NPO status , Patient's Chart, lab work & pertinent test results  Airway Mallampati: II  TM Distance: >3 FB Neck ROM: full    Dental  (+) Missing   Pulmonary sleep apnea , COPD,  COPD inhaler, former smoker,    Pulmonary exam normal breath sounds clear to auscultation       Cardiovascular hypertension, Normal cardiovascular exam Rhythm:regular Rate:Normal     Neuro/Psych    GI/Hepatic   Endo/Other    Renal/GU      Musculoskeletal   Abdominal   Peds  Hematology   Anesthesia Other Findings   Reproductive/Obstetrics                             Anesthesia Physical Anesthesia Plan  ASA: III  Anesthesia Plan: General LMA   Post-op Pain Management:  Regional for Post-op pain   Induction:   PONV Risk Score and Plan: 2 and Treatment may vary due to age or medical condition, Ondansetron and Dexamethasone  Airway Management Planned: LMA  Additional Equipment:   Intra-op Plan:   Post-operative Plan:   Informed Consent: I have reviewed the patients History and Physical, chart, labs and discussed the procedure including the risks, benefits and alternatives for the proposed anesthesia with the patient or authorized representative who has indicated his/her understanding and acceptance.     Dental Advisory Given  Plan Discussed with: CRNA  Anesthesia Plan Comments: (Block for POP, req by surgeon)        Anesthesia Quick Evaluation

## 2019-12-02 NOTE — Discharge Instructions (Signed)
Post-Op Instructions - Rotator Cuff Repair  1. Bracing: You will wear a shoulder immobilizer or sling for 6 weeks.   2. Driving: No driving for 3 weeks post-op. When driving, do not wear the immobilizer. Ideally, we recommend no driving for 6 weeks while sling is in place as one arm will be immobilized.   3. Activity: No active lifting for 2 months. Wrist, hand, and elbow motion only. Avoid lifting the upper arm away from the body except for hygiene. You are permitted to bend and straighten the elbow passively only (no active elbow motion). You may use your hand and wrist for typing, writing, and managing utensils (cutting food). Do not lift more than a coffee cup for 8 weeks.  When sleeping or resting, inclined positions (recliner chair or wedge pillow) and a pillow under the forearm for support may provide better comfort for up to 4 weeks.  Avoid long distance travel for 4 weeks.  Return to normal activities after rotator cuff repair repair normally takes 6 months on average. If rehab goes very well, may be able to do most activities at 4 months, except overhead or contact sports.  4. Physical Therapy: Begins 3-4 days after surgery, and proceed 1 time per week for the first 6 weeks, then 1-2 times per week from weeks 6-20 post-op.  5. Medications:  - You will be provided a prescription for narcotic pain medicine. After surgery, take 1-2 narcotic tablets every 4 hours if needed for severe pain.  - A prescription for anti-nausea medication will be provided in case the narcotic medicine causes nausea - take 1 tablet every 6 hours only if nauseated.   - Take tylenol 1000 mg (2 Extra Strength tablets or 3 regular strength) every 8 hours for pain.  May decrease or stop tylenol 5 days after surgery if you are having minimal pain. - Take ASA 325mg/day x 2 weeks to help prevent DVTs/PEs (blood clots).  - DO NOT take ANY nonsteroidal anti-inflammatory pain medications (Advil, Motrin, Ibuprofen, Aleve,  Naproxen, or Naprosyn). These medicines can inhibit healing of your shoulder repair.    If you are taking prescription medication for anxiety, depression, insomnia, muscle spasm, chronic pain, or for attention deficit disorder, you are advised that you are at a higher risk of adverse effects with use of narcotics post-op, including narcotic addiction/dependence, depressed breathing, death. If you use non-prescribed substances: alcohol, marijuana, cocaine, heroin, methamphetamines, etc., you are at a higher risk of adverse effects with use of narcotics post-op, including narcotic addiction/dependence, depressed breathing, death. You are advised that taking > 50 morphine milligram equivalents (MME) of narcotic pain medication per day results in twice the risk of overdose or death. For your prescription provided: oxycodone 5 mg - taking more than 6 tablets per day would result in > 50 morphine milligram equivalents (MME) of narcotic pain medication. Be advised that we will prescribe narcotics short-term, for acute post-operative pain only - 3 weeks for major operations such as shoulder repair/reconstruction surgeries.     6. Post-Op Appointment:  Your first post-op appointment will be 10-14 days post-op.  7. Work or School: For most, but not all procedures, we advise staying out of work or school for at least 1 to 2 weeks in order to recover from the stress of surgery and to allow time for healing.   If you need a work or school note this can be provided.   8. Smoking: If you are a smoker, you need to refrain from   smoking in the postoperative period. The nicotine in cigarettes will inhibit healing of your shoulder repair and decrease the chance of successful repair. Similarly, nicotine containing products (gum, patches) should be avoided.   Post-operative Brace: Apply and remove the brace you received as you were instructed to at the time of fitting and as described in detail as the brace's  instructions for use indicate.  Wear the brace for the period of time prescribed by your physician.  The brace can be cleaned with soap and water and allowed to air dry only.  Should the brace result in increased pain, decreased feeling (numbness/tingling), increased swelling or an overall worsening of your medical condition, please contact your doctor immediately.  If an emergency situation occurs as a result of wearing the brace after normal business hours, please dial 911 and seek immediate medical attention.  Let your doctor know if you have any further questions about the brace issued to you. Refer to the shoulder sling instructions for use if you have any questions regarding the correct fit of your shoulder sling.  [facility] for Troubleshooting: [phone]  Video that illustrates how to properly use a shoulder sling: "Instructions for Proper Use of an Orthopaedic Sling" [email or web address]       Information for Discharge Teaching: EXPAREL (bupivacaine liposome injectable suspension)   Your surgeon or anesthesiologist gave you EXPAREL(bupivacaine) to help control your pain after surgery.   EXPAREL is a local anesthetic that provides pain relief by numbing the tissue around the surgical site.  EXPAREL is designed to release pain medication over time and can control pain for up to 72 hours.  Depending on how you respond to EXPAREL, you may require less pain medication during your recovery.  Possible side effects:  Temporary loss of sensation or ability to move in the area where bupivacaine was injected.  Nausea, vomiting, constipation  Rarely, numbness and tingling in your mouth or lips, lightheadedness, or anxiety may occur.  Call your doctor right away if you think you may be experiencing any of these sensations, or if you have other questions regarding possible side effects.  Follow all other discharge instructions given to you by your  surgeon or nurse. Eat a healthy diet and drink plenty of water or other fluids.  If you return to the hospital for any reason within 96 hours following the administration of EXPAREL, it is important for health care providers to know that you have received this anesthetic. A teal colored band has been placed on your arm with the date, time and amount of EXPAREL you have received in order to alert and inform your health care providers. Please leave this armband in place for the full 96 hours following administration, and then you may remove the band.  General Anesthesia, Adult, Care After This sheet gives you information about how to care for yourself after your procedure. Your health care provider may also give you more specific instructions. If you have problems or questions, contact your health care provider. What can I expect after the procedure? After the procedure, the following side effects are common:  Pain or discomfort at the IV site.  Nausea.  Vomiting.  Sore throat.  Trouble concentrating.  Feeling cold or chills.  Weak or tired.  Sleepiness and fatigue.  Soreness and body aches. These side effects can affect parts of the body that were not involved in surgery. Follow these instructions at home:  For at least 24 hours after the procedure:  Have a  responsible adult stay with you. It is important to have someone help care for you until you are awake and alert.  Rest as needed.  Do not: ? Participate in activities in which you could fall or become injured. ? Drive. ? Use heavy machinery. ? Drink alcohol. ? Take sleeping pills or medicines that cause drowsiness. ? Make important decisions or sign legal documents. ? Take care of children on your own. Eating and drinking  Follow any instructions from your health care provider about eating or drinking restrictions.  When you feel hungry, start by eating small amounts of foods that are soft and easy to digest (bland),  such as toast. Gradually return to your regular diet.  Drink enough fluid to keep your urine pale yellow.  If you vomit, rehydrate by drinking water, juice, or clear broth. General instructions  If you have sleep apnea, surgery and certain medicines can increase your risk for breathing problems. Follow instructions from your health care provider about wearing your sleep device: ? Anytime you are sleeping, including during daytime naps. ? While taking prescription pain medicines, sleeping medicines, or medicines that make you drowsy.  Return to your normal activities as told by your health care provider. Ask your health care provider what activities are safe for you.  Take over-the-counter and prescription medicines only as told by your health care provider.  If you smoke, do not smoke without supervision.  Keep all follow-up visits as told by your health care provider. This is important. Contact a health care provider if:  You have nausea or vomiting that does not get better with medicine.  You cannot eat or drink without vomiting.  You have pain that does not get better with medicine.  You are unable to pass urine.  You develop a skin rash.  You have a fever.  You have redness around your IV site that gets worse. Get help right away if:  You have difficulty breathing.  You have chest pain.  You have blood in your urine or stool, or you vomit blood. Summary  After the procedure, it is common to have a sore throat or nausea. It is also common to feel tired.  Have a responsible adult stay with you for the first 24 hours after general anesthesia. It is important to have someone help care for you until you are awake and alert.  When you feel hungry, start by eating small amounts of foods that are soft and easy to digest (bland), such as toast. Gradually return to your regular diet.  Drink enough fluid to keep your urine pale yellow.  Return to your normal activities as  told by your health care provider. Ask your health care provider what activities are safe for you. This information is not intended to replace advice given to you by your health care provider. Make sure you discuss any questions you have with your health care provider. Document Revised: 06/12/2017 Document Reviewed: 01/23/2017 Elsevier Patient Education  [place].

## 2019-12-02 NOTE — Anesthesia Procedure Notes (Signed)
Anesthesia Regional Block: Interscalene brachial plexus block   Pre-Anesthetic Checklist: ,, timeout performed, Correct Patient, Correct Site, Correct Laterality, Correct Procedure, Correct Position, site marked, Risks and benefits discussed,  Surgical consent,  Pre-op evaluation,  At surgeon's request and post-op pain management  Laterality: Right  Prep: chloraprep       Needles:  Injection technique: Single-shot  Needle Type: Stimiplex     Needle Length: 10cm  Needle Gauge: 21     Additional Needles:   Procedures:,,,, ultrasound used (permanent image in chart),,,,  Narrative:  Start time: 12/02/2019 11:34 AM End time: 12/02/2019 11:40 AM Injection made incrementally with aspirations every 5 mL.  Performed by: Personally  Anesthesiologist: Ronelle Nigh, MD  Additional Notes: Functioning IV was confirmed and monitors applied. Ultrasound guidance: relevant anatomy identified, needle position confirmed, local anesthetic spread visualized around nerve(s)., vascular puncture avoided.  Image printed for medical record.  Negative aspiration and no paresthesias; incremental administration of local anesthetic. The patient tolerated the procedure well. Vitals signes recorded in RN notes.

## 2019-12-02 NOTE — Anesthesia Postprocedure Evaluation (Signed)
Anesthesia Post Note  Patient: Kirk Wright  Procedure(s) Performed: RIGHT SHOULDER ARTHROSCOPY VS MINI OPEN ROTATOR CUFF REPAIR, SUBACROMIAL DECOMPRESSION AND BICEPS TENODESIS (Right Shoulder)     Patient location during evaluation: PACU Anesthesia Type: General Level of consciousness: awake and alert and oriented Pain management: satisfactory to patient Vital Signs Assessment: post-procedure vital signs reviewed and stable Respiratory status: spontaneous breathing, nonlabored ventilation and respiratory function stable Cardiovascular status: blood pressure returned to baseline and stable Postop Assessment: Adequate PO intake and No signs of nausea or vomiting Anesthetic complications: no   No complications documented.  Raliegh Ip

## 2019-12-02 NOTE — Transfer of Care (Signed)
Immediate Anesthesia Transfer of Care Note  Patient: Kirk Wright  Procedure(s) Performed: RIGHT SHOULDER ARTHROSCOPY VS MINI OPEN ROTATOR CUFF REPAIR, SUBACROMIAL DECOMPRESSION AND BICEPS TENODESIS (Right Shoulder)  Patient Location: PACU  Anesthesia Type: General LMA  Level of Consciousness: awake, alert  and patient cooperative  Airway and Oxygen Therapy: Patient Spontanous Breathing and Patient connected to supplemental oxygen  Post-op Assessment: Post-op Vital signs reviewed, Patient's Cardiovascular Status Stable, Respiratory Function Stable, Patent Airway and No signs of Nausea or vomiting  Post-op Vital Signs: Reviewed and stable  Complications: No complications documented.

## 2019-12-02 NOTE — Anesthesia Procedure Notes (Signed)
Procedure Name: LMA Insertion Date/Time: 12/02/2019 12:48 PM Performed by: Jeannene Patella, CRNA Pre-anesthesia Checklist: Patient identified, Emergency Drugs available, Suction available, Timeout performed and Patient being monitored Patient Re-evaluated:Patient Re-evaluated prior to induction Oxygen Delivery Method: Circle system utilized Preoxygenation: Pre-oxygenation with 100% oxygen Induction Type: IV induction LMA: LMA inserted LMA Size: 4.0 Number of attempts: 1 Placement Confirmation: positive ETCO2 and breath sounds checked- equal and bilateral Tube secured with: Tape

## 2019-12-02 NOTE — H&P (Signed)
Paper H&P to be scanned into permanent record. H&P reviewed. No significant changes noted.  

## 2019-12-02 NOTE — Op Note (Signed)
SURGERY DATE: 12/02/2019  PRE-OP DIAGNOSIS:  1. Right subacromial impingement 2. Right biceps tendinopathy 3. Right rotator cuff tear   POST-OP DIAGNOSIS: 1. Right subacromial impingement 2. Right biceps tendinopathy 3. Right rotator cuff tear  PROCEDURES:  1. Right arthroscopic rotator cuff repair 2. Right arthroscopic biceps tenodesis 3. Right arthroscopic subacromial decompression 4. Right arthroscopic extensive debridement of shoulder (glenohumeral and subacromial spaces)   SURGEON: Cato Mulligan, MD  ASSISTANT:  none  ANESTHESIA: Gen with Exparil interscalene block  ESTIMATED BLOOD LOSS: 5cc  DRAINS:  none  TOTAL IV FLUIDS: per anesthesia   SPECIMENS: none  IMPLANTS:  - Arthrex 2.68mm PushLock x 1 - Arthrex 4.25mm SwiveLock x 3 - Iconix SPEED double loaded with 1.2 and 2.53mm tape x 1    OPERATIVE FINDINGS:  Examination under anesthesia: A careful examination under anesthesia was performed.  Passive range of motion was: FF: 150; ER at side: 45; ER in abduction: 90; IR in abduction: 60.  Anterior load shift: NT.  Posterior load shift: NT.  Sulcus in neutral: NT.  Sulcus in ER: NT.    Intra-operative findings: A thorough arthroscopic examination of the shoulder was performed.  The findings are: 1. Biceps tendon: tendinopathy with significant erythema  2. Superior labrum: erythema 3. Posterior labrum and capsule: normal 4. Inferior capsule and inferior recess: normal 5. Glenoid cartilage surface: Normal 6. Supraspinatus attachment: full-thickness tear of the supraspinatus 7. Posterior rotator cuff attachment: normal 8. Humeral head articular cartilage: normal 9. Rotator interval: significant synovitis 10: Subscapularis tendon: attachment intact 11. Anterior labrum: Mildly degenerative 12. IGHL: normal  OPERATIVE REPORT:   Indications for procedure: Kirk Wright is a 74 y.o. male with approximately 3 months of right shoulder pain after he felt a  tearing sensation while lifting weights at the gym.  He has had difficulty with overhead motion since that time with sensations of weakness. Clinical exam and MRI were suggestive of rotator cuff tear, biceps tendinopathy, and subacromial impingement. After discussion of risks, benefits, and alternatives to surgery, the patient elected to proceed.   Procedure in detail:  I identified Kirk Wright in the pre-operative holding area.  I marked the operative shoulder with my initials. I reviewed the risks and benefits of the proposed surgical intervention, and the patient (and/or patient's guardian) wished to proceed.  Anesthesia was then performed with an Exparil interscalene block.  The patient was transferred to the operative suite and placed in the beach chair position.    Appropriate IV antibiotics were administered prior to incision. The operative upper extremity was then prepped and draped in standard fashion. A time out was performed confirming the correct extremity, correct patient, and correct procedure.   I then created a standard posterior portal with an 11 blade. The glenohumeral joint was easily entered with a blunt trocar and the arthroscope introduced. The findings of diagnostic arthroscopy are described above. I debrided degenerative tissue including the synovitic tissue about the rotator interval and anterior and superior labrum. I then coagulated the inflamed synovium to obtain hemostasis and reduce the risk of post-operative swelling using an Arthrocare radiofrequency device.  I then turned my attention to the arthroscopic biceps tenodesis.  I used the loop n tack technique to pass a fiber tape through the biceps in a locked fashion adjacent to the biceps anchor.  A hole for a 2.9 mm Arthrex PushLock was drilled in the bicipital groove just superior to the subscapularis tendon insertion.  The biceps tendon was then cut.  The fiber tape was loaded onto the push lock anchor and  impacted into place into the previously drilled hole in the bicipital groove.  This appropriately secured the biceps into the bicipital groove and took it off of tension.  Next, the arthroscope was then introduced into the subacromial space. A direct lateral portal was created with an 11-blade after spinal needle localization. An extensive subacromial bursectomy was performed using a combination of the shaver and Arthrocare wand. The entire acromial undersurface was exposed and the CA ligament was subperiosteally elevated to expose the anterior acromial hook. A burr was used to create a flat anterior and lateral aspect of the acromion, converting it from a Type 2 to a Type 1 acromion. Care was made to keep the deltoid fascia intact.  Next I created an accessory posterolateral portal to assist with visualization and instrumentation.  I debrided the poor quality edges of the supraspinatus tendon.  This was a U-shaped tear of the supraspinatus.  I prepared the footprint using a burr to expose bleeding bone.   I then percutaneously placed 1 Iconix SPEED medial row anchor along the anterior portion of the tear at the articular margin.  I tend to place another anchor in a similar fashion, but it pulled out of bone.  Therefore the sutures were loaded onto a 4.75 mm SwiveLock anchor along the posterior portion of the tear at the articular margin.  This achieved appropriate fixation.  I then shuttled all 8 strands of tape through the rotator cuff just lateral to the musculotendinous junction using a scorpion suture passer.  The posterior strands of each suture were passed through an [employer] anchor.  This was placed approximately 2 cm distal to the lateral edge of the footprint in line with the posterior aspect of the tear with appropriate tensioning of each suture prior to final fixation.  Similarly, the anterior strands of each suture were passed through another SwiveLock anchor along the anterior margin of  the tear.  There were 2 small dogears, one anteriorly and 1 posteriorly.  The FiberWire sutures from each lateral row anchor was passed through the dog ear and arthroscopic knots were tied to reduce these.  This construct allowed for excellent reapproximation of the rotator cuff to its native footprint without undue tension.  Appropriate compression was achieved.  The repair was stable to external and internal rotation.  Fluid was evacuated from the shoulder, and the portals were closed with 3-0 Nylon. Xeroform was applied to the portals. A sterile dressing was applied, followed by a Polar Care sleeve and a SlingShot shoulder immobilizer/sling. The patient was awakened from anesthesia without difficulty and was transferred to the PACU in stable condition.   COMPLICATIONS: none  DISPOSITION: plan for discharge home after recovery in PACU   POSTOPERATIVE PLAN: Remain in sling (except hygiene and elbow/wrist/hand RoM exercises as instructed by PT) x 6 weeks and NWB for this time. PT to begin 3-4 days after surgery.  Rotator cuff repair rehab protocol. ASA 325mg  daily x 2 weeks for DVT ppx.

## 2019-12-02 NOTE — Progress Notes (Signed)
Assisted Mike Stella ANMD  with right, ultrasound guided, interscalene  block. Side rails up, monitors on throughout procedure. See vital signs in flow sheet. Tolerated Procedure well.  

## 2019-12-05 ENCOUNTER — Encounter: Payer: Self-pay | Admitting: Orthopedic Surgery

## 2019-12-06 DIAGNOSIS — M6281 Muscle weakness (generalized): Secondary | ICD-10-CM | POA: Diagnosis not present

## 2019-12-06 DIAGNOSIS — M25611 Stiffness of right shoulder, not elsewhere classified: Secondary | ICD-10-CM | POA: Diagnosis not present

## 2019-12-06 DIAGNOSIS — M25511 Pain in right shoulder: Secondary | ICD-10-CM | POA: Diagnosis not present

## 2019-12-06 DIAGNOSIS — Z9889 Other specified postprocedural states: Secondary | ICD-10-CM | POA: Diagnosis not present

## 2019-12-12 DIAGNOSIS — H401133 Primary open-angle glaucoma, bilateral, severe stage: Secondary | ICD-10-CM | POA: Diagnosis not present

## 2019-12-15 DIAGNOSIS — M25611 Stiffness of right shoulder, not elsewhere classified: Secondary | ICD-10-CM | POA: Diagnosis not present

## 2019-12-15 DIAGNOSIS — Z9889 Other specified postprocedural states: Secondary | ICD-10-CM | POA: Diagnosis not present

## 2019-12-15 DIAGNOSIS — M25511 Pain in right shoulder: Secondary | ICD-10-CM | POA: Diagnosis not present

## 2019-12-15 DIAGNOSIS — M6281 Muscle weakness (generalized): Secondary | ICD-10-CM | POA: Diagnosis not present

## 2019-12-19 DIAGNOSIS — Z8546 Personal history of malignant neoplasm of prostate: Secondary | ICD-10-CM | POA: Diagnosis not present

## 2019-12-19 DIAGNOSIS — R3912 Poor urinary stream: Secondary | ICD-10-CM | POA: Diagnosis not present

## 2019-12-19 DIAGNOSIS — E669 Obesity, unspecified: Secondary | ICD-10-CM | POA: Diagnosis not present

## 2019-12-19 DIAGNOSIS — J449 Chronic obstructive pulmonary disease, unspecified: Secondary | ICD-10-CM | POA: Diagnosis not present

## 2019-12-19 DIAGNOSIS — Z79899 Other long term (current) drug therapy: Secondary | ICD-10-CM | POA: Diagnosis not present

## 2019-12-19 DIAGNOSIS — N401 Enlarged prostate with lower urinary tract symptoms: Secondary | ICD-10-CM | POA: Diagnosis not present

## 2019-12-19 DIAGNOSIS — E782 Mixed hyperlipidemia: Secondary | ICD-10-CM | POA: Diagnosis not present

## 2019-12-19 DIAGNOSIS — Z131 Encounter for screening for diabetes mellitus: Secondary | ICD-10-CM | POA: Diagnosis not present

## 2019-12-19 DIAGNOSIS — I10 Essential (primary) hypertension: Secondary | ICD-10-CM | POA: Diagnosis not present

## 2019-12-19 DIAGNOSIS — E538 Deficiency of other specified B group vitamins: Secondary | ICD-10-CM | POA: Diagnosis not present

## 2019-12-20 DIAGNOSIS — M25611 Stiffness of right shoulder, not elsewhere classified: Secondary | ICD-10-CM | POA: Diagnosis not present

## 2019-12-20 DIAGNOSIS — M25511 Pain in right shoulder: Secondary | ICD-10-CM | POA: Diagnosis not present

## 2019-12-20 DIAGNOSIS — M6281 Muscle weakness (generalized): Secondary | ICD-10-CM | POA: Diagnosis not present

## 2019-12-20 DIAGNOSIS — Z9889 Other specified postprocedural states: Secondary | ICD-10-CM | POA: Diagnosis not present

## 2020-01-04 DIAGNOSIS — Z9889 Other specified postprocedural states: Secondary | ICD-10-CM | POA: Diagnosis not present

## 2020-01-04 DIAGNOSIS — M25511 Pain in right shoulder: Secondary | ICD-10-CM | POA: Diagnosis not present

## 2020-01-04 DIAGNOSIS — M25611 Stiffness of right shoulder, not elsewhere classified: Secondary | ICD-10-CM | POA: Diagnosis not present

## 2020-01-04 DIAGNOSIS — M6281 Muscle weakness (generalized): Secondary | ICD-10-CM | POA: Diagnosis not present

## 2020-01-17 DIAGNOSIS — M6281 Muscle weakness (generalized): Secondary | ICD-10-CM | POA: Diagnosis not present

## 2020-01-17 DIAGNOSIS — Z9889 Other specified postprocedural states: Secondary | ICD-10-CM | POA: Diagnosis not present

## 2020-01-17 DIAGNOSIS — M25511 Pain in right shoulder: Secondary | ICD-10-CM | POA: Diagnosis not present

## 2020-01-17 DIAGNOSIS — M25611 Stiffness of right shoulder, not elsewhere classified: Secondary | ICD-10-CM | POA: Diagnosis not present

## 2020-01-19 DIAGNOSIS — M25511 Pain in right shoulder: Secondary | ICD-10-CM | POA: Diagnosis not present

## 2020-01-19 DIAGNOSIS — M6281 Muscle weakness (generalized): Secondary | ICD-10-CM | POA: Diagnosis not present

## 2020-01-19 DIAGNOSIS — Z9889 Other specified postprocedural states: Secondary | ICD-10-CM | POA: Diagnosis not present

## 2020-01-19 DIAGNOSIS — M25611 Stiffness of right shoulder, not elsewhere classified: Secondary | ICD-10-CM | POA: Diagnosis not present

## 2020-01-26 ENCOUNTER — Other Ambulatory Visit: Payer: Self-pay | Admitting: *Deleted

## 2020-01-26 DIAGNOSIS — Z9889 Other specified postprocedural states: Secondary | ICD-10-CM | POA: Diagnosis not present

## 2020-01-26 DIAGNOSIS — N401 Enlarged prostate with lower urinary tract symptoms: Secondary | ICD-10-CM

## 2020-01-26 DIAGNOSIS — R972 Elevated prostate specific antigen [PSA]: Secondary | ICD-10-CM

## 2020-01-26 DIAGNOSIS — M25511 Pain in right shoulder: Secondary | ICD-10-CM | POA: Diagnosis not present

## 2020-01-26 DIAGNOSIS — M25611 Stiffness of right shoulder, not elsewhere classified: Secondary | ICD-10-CM | POA: Diagnosis not present

## 2020-01-26 DIAGNOSIS — M6281 Muscle weakness (generalized): Secondary | ICD-10-CM | POA: Diagnosis not present

## 2020-01-26 NOTE — Addendum Note (Signed)
Addended by: Despina Hidden on: 01/26/2020 11:20 AM   Modules accepted: Orders

## 2020-01-26 NOTE — Progress Notes (Signed)
01/27/2020 11:43 AM   Kirk Wright July 14, 1945 [id number]  Referring provider: [name], NP [address] [place],  [place] [address] Chief Complaint  Patient presents with  . Follow-up    6MTH FOLLOW-UP    HPI: Kirk Wright is a 74 y.o. M with multiple GU issues including prostate, cancer and ED presents for today for a 6 month follow up.   Very low risk prostate cancer Patient underwent a prostate biopsy on 11/14/2012 with Dr. Elnoria Wright for a PSA of 6.86. 58 gram prostate. Results were Gleason 3+3 cancer in 2/12 cores. 2% core of lateral mid and 8% of lateral apex. He was then referred to Dr. Vale Wright on 01/25/2013 for possible robotic prostatectomy.He elected to defer this. He had a repeat prostate biopsy in May 2018 which was negative for disease.Patient chose to manage his prostate cancer with active surveillance.  Most recent imaging in the form of prostate MRI completed in 12/2017 showed no suspicious or high-grade lesions, PI-RADS 1.  Repeat MR 01/27/19 stable, no high grade lesions. Markedly enlarged transition zone, prostomegaly 150 cc.  Most recent SPA down to 9.84 on 07/22/19.   PSA was not done today in advance of today's visit, pending.    Right hydrocele  S/p hydrocelectomy on 07/29/19.  Patient recently had rotator cuff surgery and reports some tenderness s/p procedure.    PMH: Past Medical History:  Diagnosis Date  . Anemia   . Cataract   . COPD (chronic obstructive pulmonary disease) ([place])   . DJD (degenerative joint disease)    shoulder  . Elevated PSA   . Erectile dysfunction   . Hypertension    controlled on meds  . Neutropenia ([place])   . Obesity   . Obesity   . Pneumonia   . POAG (primary open-angle glaucoma)   . Pseudophakia of both eyes   . Sickle cell trait ([place])   . Sleep apnea    not using CPAP  . Vitamin B 12 deficiency   . Wears partial dentures    upper and lower    Surgical History: Past  Surgical History:  Procedure Laterality Date  . COLONOSCOPY WITH PROPOFOL N/A 02/23/2015   Procedure: COLONOSCOPY WITH PROPOFOL;  Surgeon: [name], MD;  Location: [facility] ENDOSCOPY;  Service: Endoscopy;  Laterality: N/A;  . EYE SURGERY    . GLAUCOMA SURGERY    . HERNIA REPAIR Right   . HYDROCELE EXCISION Right 08/15/2019   Procedure: HYDROCELECTOMY ADULT;  Surgeon: Kirk Espy, MD;  Location: [facility] ORS;  Service: Urology;  Laterality: Right;  . PROSTRATE BX    . SHOULDER ARTHROSCOPY WITH ROTATOR CUFF REPAIR AND OPEN BICEPS TENODESIS Right 12/02/2019   Procedure: RIGHT SHOULDER ARTHROSCOPY VS MINI OPEN ROTATOR CUFF REPAIR, SUBACROMIAL DECOMPRESSION AND BICEPS TENODESIS;  Surgeon: Kirk Fabry, MD;  Location: [place];  Service: Orthopedics;  Laterality: Right;    Home Medications:  Allergies as of 01/27/2020      Reactions   Contrast Media [iodinated Diagnostic Agents] Rash      Medication List       Accurate as of January 27, 2020 11:59 PM. If you have any questions, ask your nurse or doctor.        STOP taking these medications   docusate sodium 100 MG capsule Commonly known as: COLACE Stopped by: Kirk Espy, MD     TAKE these medications   acetaminophen 500 MG tablet Commonly known as: TYLENOL Take 2 tablets (1,000  mg total) by mouth every 8 (eight) hours.   bimatoprost 0.03 % ophthalmic solution Commonly known as: LUMIGAN Place 1 drop into both eyes at bedtime.   brimonidine-timolol 0.2-0.5 % ophthalmic solution Commonly known as: COMBIGAN Place 1 drop into both eyes every 12 (twelve) hours.   budesonide-formoterol 160-4.5 MCG/ACT inhaler Commonly known as: SYMBICORT Inhale 2 puffs into the lungs in the morning and at bedtime.   ferrous sulfate 325 (65 FE) MG tablet Take 325 mg by mouth daily with breakfast.   lisinopril-hydrochlorothiazide 20-25 MG tablet Commonly known as: ZESTORETIC Take 1 tablet by mouth daily.   ondansetron 4 MG  disintegrating tablet Commonly known as: Zofran ODT Take 1 tablet (4 mg total) by mouth every 8 (eight) hours as needed for nausea or vomiting.   oxyCODONE 5 MG immediate release tablet Commonly known as: Roxicodone Take 1-2 tablets (5-10 mg total) by mouth every 4 (four) hours as needed (pain).   ProAir HFA 108 (90 Base) MCG/ACT inhaler Generic drug: albuterol Inhale 2 puffs into the lungs every 6 (six) hours as needed for wheezing or shortness of breath.   Systane Balance 0.6 % Soln Generic drug: Propylene Glycol Place 1 drop into both eyes 3 (three) times daily as needed (dry/irriated eyes.).   tamsulosin 0.4 MG Caps capsule Commonly known as: FLOMAX Take 0.4 mg by mouth daily.   Vitamin B-12 5000 MCG Tbdp Take 5,000 mcg by mouth daily.       Allergies:  Allergies  Allergen Reactions  . Contrast Media [Iodinated Diagnostic Agents] Rash    Family History: Family History  Problem Relation Age of Onset  . Kidney cancer Neg Hx   . Kidney disease Neg Hx   . Prostate cancer Neg Hx   . Bladder Cancer Neg Hx     Social History:  reports that he has quit smoking. His smoking use included cigarettes. He has never used smokeless tobacco. He reports that he does not drink alcohol and does not use drugs.   Physical Exam: BP 120/62   Pulse 64   Ht 6' (1.829 m)   Wt 227 lb (103 kg)   BMI 30.79 kg/m   Constitutional:  Alert and oriented, No acute distress. HEENT: [place] AT, moist mucus membranes.  Trachea midline, no masses. Cardiovascular: No clubbing, cyanosis, or edema. Respiratory: Normal respiratory effort, no increased work of breathing. GI: Abdomen is soft, nontender, nondistended, no abdominal masses GU: No CVA tenderness Rectal: Normal sphincter tone, 50 g prostate, rubbery midline nodules, no tenderness. Skin: No rashes, bruises or suspicious lesions. Neurologic: Grossly intact, no focal deficits, moving all 4 extremities. Psychiatric: Normal mood and  affect.  Laboratory Data:  Lab Results  Component Value Date   CREATININE 1.61 (H) 08/11/2019    Assessment & Plan:    1. Prostate cancer [facility]) Patient feels like he does not have cancer since there was no evidence on up to date biopsy and MRI. I discussed with him that he does in fact have very low risk prostate cancer and will remains on active surveillance.  DRE shows a rubbery midline nodule otherwise normal exam.   PSA today since it was not drawn prior to visit   Follow up in 6 months with PSA.   [place] [address] [place], [place] [address] [phone]  I, Selena Batten, am acting as a scribe for Dr. Hollice Wright.  I have reviewed the above documentation for accuracy and completeness, and I agree with  the above.   Kirk Espy, MD

## 2020-01-27 ENCOUNTER — Other Ambulatory Visit: Payer: Self-pay

## 2020-01-27 ENCOUNTER — Other Ambulatory Visit
Admission: RE | Admit: 2020-01-27 | Discharge: 2020-01-27 | Disposition: A | Discharge status: Home / Self Care | Payer: PPO | Attending: Urology | Admitting: Urology

## 2020-01-27 ENCOUNTER — Ambulatory Visit: Payer: PPO | Admitting: Urology

## 2020-01-27 ENCOUNTER — Encounter: Payer: Self-pay | Admitting: Urology

## 2020-01-27 VITALS — BP 120/62 | HR 64 | Ht 72.0 in | Wt 227.0 lb

## 2020-01-27 DIAGNOSIS — C61 Malignant neoplasm of prostate: Secondary | ICD-10-CM | POA: Diagnosis not present

## 2020-01-27 DIAGNOSIS — R972 Elevated prostate specific antigen [PSA]: Secondary | ICD-10-CM | POA: Diagnosis not present

## 2020-01-27 LAB — PSA: Prostatic Specific Antigen: 10.59 ng/mL — ABNORMAL HIGH (ref 0.00–4.00)

## 2020-01-31 DIAGNOSIS — Z9889 Other specified postprocedural states: Secondary | ICD-10-CM | POA: Diagnosis not present

## 2020-01-31 DIAGNOSIS — M25511 Pain in right shoulder: Secondary | ICD-10-CM | POA: Diagnosis not present

## 2020-01-31 DIAGNOSIS — M25611 Stiffness of right shoulder, not elsewhere classified: Secondary | ICD-10-CM | POA: Diagnosis not present

## 2020-01-31 DIAGNOSIS — M6281 Muscle weakness (generalized): Secondary | ICD-10-CM | POA: Diagnosis not present

## 2020-02-01 ENCOUNTER — Telehealth: Payer: Self-pay | Admitting: *Deleted

## 2020-02-01 NOTE — Telephone Encounter (Addendum)
Patient informed, voiced understanding.  ----- Message from Hollice Espy, MD sent at 01/30/2020  2:31 PM EDT ----- PSA is up slightly from 6 months ago with down from a year ago.  We will continue keep a very close eye on this.  We will recheck it at your follow-up visit in 6 months.  Please ensure that you will get your blood drawn prior to your next visit to discuss options.

## 2020-02-02 DIAGNOSIS — M6281 Muscle weakness (generalized): Secondary | ICD-10-CM | POA: Diagnosis not present

## 2020-02-02 DIAGNOSIS — M25611 Stiffness of right shoulder, not elsewhere classified: Secondary | ICD-10-CM | POA: Diagnosis not present

## 2020-02-02 DIAGNOSIS — M25511 Pain in right shoulder: Secondary | ICD-10-CM | POA: Diagnosis not present

## 2020-02-02 DIAGNOSIS — Z9889 Other specified postprocedural states: Secondary | ICD-10-CM | POA: Diagnosis not present

## 2020-02-07 DIAGNOSIS — M6281 Muscle weakness (generalized): Secondary | ICD-10-CM | POA: Diagnosis not present

## 2020-02-07 DIAGNOSIS — M25611 Stiffness of right shoulder, not elsewhere classified: Secondary | ICD-10-CM | POA: Diagnosis not present

## 2020-02-07 DIAGNOSIS — M25511 Pain in right shoulder: Secondary | ICD-10-CM | POA: Diagnosis not present

## 2020-02-07 DIAGNOSIS — Z9889 Other specified postprocedural states: Secondary | ICD-10-CM | POA: Diagnosis not present

## 2020-02-09 DIAGNOSIS — Z9889 Other specified postprocedural states: Secondary | ICD-10-CM | POA: Diagnosis not present

## 2020-02-09 DIAGNOSIS — M25611 Stiffness of right shoulder, not elsewhere classified: Secondary | ICD-10-CM | POA: Diagnosis not present

## 2020-02-09 DIAGNOSIS — M25511 Pain in right shoulder: Secondary | ICD-10-CM | POA: Diagnosis not present

## 2020-02-09 DIAGNOSIS — M6281 Muscle weakness (generalized): Secondary | ICD-10-CM | POA: Diagnosis not present

## 2020-02-14 DIAGNOSIS — M25511 Pain in right shoulder: Secondary | ICD-10-CM | POA: Diagnosis not present

## 2020-02-14 DIAGNOSIS — M25611 Stiffness of right shoulder, not elsewhere classified: Secondary | ICD-10-CM | POA: Diagnosis not present

## 2020-02-14 DIAGNOSIS — M6281 Muscle weakness (generalized): Secondary | ICD-10-CM | POA: Diagnosis not present

## 2020-02-14 DIAGNOSIS — Z9889 Other specified postprocedural states: Secondary | ICD-10-CM | POA: Diagnosis not present

## 2020-02-16 DIAGNOSIS — M6281 Muscle weakness (generalized): Secondary | ICD-10-CM | POA: Diagnosis not present

## 2020-02-16 DIAGNOSIS — M25611 Stiffness of right shoulder, not elsewhere classified: Secondary | ICD-10-CM | POA: Diagnosis not present

## 2020-02-16 DIAGNOSIS — Z9889 Other specified postprocedural states: Secondary | ICD-10-CM | POA: Diagnosis not present

## 2020-02-16 DIAGNOSIS — M25511 Pain in right shoulder: Secondary | ICD-10-CM | POA: Diagnosis not present

## 2020-02-23 DIAGNOSIS — M25611 Stiffness of right shoulder, not elsewhere classified: Secondary | ICD-10-CM | POA: Diagnosis not present

## 2020-02-23 DIAGNOSIS — M6281 Muscle weakness (generalized): Secondary | ICD-10-CM | POA: Diagnosis not present

## 2020-02-23 DIAGNOSIS — Z9889 Other specified postprocedural states: Secondary | ICD-10-CM | POA: Diagnosis not present

## 2020-02-23 DIAGNOSIS — M25511 Pain in right shoulder: Secondary | ICD-10-CM | POA: Diagnosis not present

## 2020-02-29 DIAGNOSIS — Z9889 Other specified postprocedural states: Secondary | ICD-10-CM | POA: Diagnosis not present

## 2020-02-29 DIAGNOSIS — M25611 Stiffness of right shoulder, not elsewhere classified: Secondary | ICD-10-CM | POA: Diagnosis not present

## 2020-02-29 DIAGNOSIS — M6281 Muscle weakness (generalized): Secondary | ICD-10-CM | POA: Diagnosis not present

## 2020-02-29 DIAGNOSIS — M25511 Pain in right shoulder: Secondary | ICD-10-CM | POA: Diagnosis not present

## 2020-03-02 DIAGNOSIS — M25611 Stiffness of right shoulder, not elsewhere classified: Secondary | ICD-10-CM | POA: Diagnosis not present

## 2020-03-02 DIAGNOSIS — M6281 Muscle weakness (generalized): Secondary | ICD-10-CM | POA: Diagnosis not present

## 2020-03-02 DIAGNOSIS — M25511 Pain in right shoulder: Secondary | ICD-10-CM | POA: Diagnosis not present

## 2020-03-02 DIAGNOSIS — Z9889 Other specified postprocedural states: Secondary | ICD-10-CM | POA: Diagnosis not present

## 2020-03-14 DIAGNOSIS — G4733 Obstructive sleep apnea (adult) (pediatric): Secondary | ICD-10-CM | POA: Diagnosis not present

## 2020-03-14 DIAGNOSIS — Z8669 Personal history of other diseases of the nervous system and sense organs: Secondary | ICD-10-CM | POA: Diagnosis not present

## 2020-03-14 DIAGNOSIS — J449 Chronic obstructive pulmonary disease, unspecified: Secondary | ICD-10-CM | POA: Diagnosis not present

## 2020-03-14 DIAGNOSIS — H1032 Unspecified acute conjunctivitis, left eye: Secondary | ICD-10-CM | POA: Diagnosis not present

## 2020-03-14 DIAGNOSIS — J069 Acute upper respiratory infection, unspecified: Secondary | ICD-10-CM | POA: Diagnosis not present

## 2020-03-15 DIAGNOSIS — H5941 Inflammation (infection) of postprocedural bleb, stage 1: Secondary | ICD-10-CM | POA: Diagnosis not present

## 2020-03-15 DIAGNOSIS — H401133 Primary open-angle glaucoma, bilateral, severe stage: Secondary | ICD-10-CM | POA: Diagnosis not present

## 2020-03-19 DIAGNOSIS — H401133 Primary open-angle glaucoma, bilateral, severe stage: Secondary | ICD-10-CM | POA: Diagnosis not present

## 2020-03-19 DIAGNOSIS — H5941 Inflammation (infection) of postprocedural bleb, stage 1: Secondary | ICD-10-CM | POA: Diagnosis not present

## 2020-04-27 DIAGNOSIS — H5941 Inflammation (infection) of postprocedural bleb, stage 1: Secondary | ICD-10-CM | POA: Diagnosis not present

## 2020-04-27 DIAGNOSIS — H401133 Primary open-angle glaucoma, bilateral, severe stage: Secondary | ICD-10-CM | POA: Diagnosis not present

## 2020-05-02 DIAGNOSIS — Z8669 Personal history of other diseases of the nervous system and sense organs: Secondary | ICD-10-CM | POA: Diagnosis not present

## 2020-05-02 DIAGNOSIS — Z23 Encounter for immunization: Secondary | ICD-10-CM | POA: Diagnosis not present

## 2020-05-02 DIAGNOSIS — I10 Essential (primary) hypertension: Secondary | ICD-10-CM | POA: Diagnosis not present

## 2020-05-02 DIAGNOSIS — G4733 Obstructive sleep apnea (adult) (pediatric): Secondary | ICD-10-CM | POA: Diagnosis not present

## 2020-05-02 DIAGNOSIS — J449 Chronic obstructive pulmonary disease, unspecified: Secondary | ICD-10-CM | POA: Diagnosis not present

## 2020-05-02 DIAGNOSIS — H539 Unspecified visual disturbance: Secondary | ICD-10-CM | POA: Diagnosis not present

## 2020-05-02 DIAGNOSIS — Z599 Problem related to housing and economic circumstances, unspecified: Secondary | ICD-10-CM | POA: Diagnosis not present

## 2020-07-27 ENCOUNTER — Ambulatory Visit: Payer: PPO | Admitting: Urology

## 2020-09-18 ENCOUNTER — Other Ambulatory Visit: Payer: Self-pay | Admitting: Nurse Practitioner

## 2020-09-18 DIAGNOSIS — N644 Mastodynia: Secondary | ICD-10-CM

## 2020-09-24 ENCOUNTER — Other Ambulatory Visit: Payer: Self-pay | Admitting: Nurse Practitioner

## 2020-09-24 DIAGNOSIS — N644 Mastodynia: Secondary | ICD-10-CM

## 2020-09-26 ENCOUNTER — Ambulatory Visit
Admission: RE | Admit: 2020-09-26 | Discharge: 2020-09-26 | Disposition: A | Discharge status: Home / Self Care | Payer: Medicare Other | Source: Ambulatory Visit | Attending: Nurse Practitioner | Admitting: Nurse Practitioner

## 2020-09-26 ENCOUNTER — Other Ambulatory Visit: Payer: Self-pay

## 2020-09-26 DIAGNOSIS — N644 Mastodynia: Secondary | ICD-10-CM | POA: Diagnosis not present

## 2021-05-21 ENCOUNTER — Ambulatory Visit
Admission: RE | Admit: 2021-05-21 | Discharge: 2021-05-21 | Disposition: A | Discharge status: Home / Self Care | Payer: Medicare Other | Attending: Gastroenterology | Admitting: Gastroenterology

## 2021-05-21 ENCOUNTER — Ambulatory Visit: Payer: Medicare Other | Admitting: Certified Registered"

## 2021-05-21 ENCOUNTER — Encounter: Payer: Self-pay | Admitting: *Deleted

## 2021-05-21 ENCOUNTER — Encounter
Admission: RE | Disposition: A | Discharge status: Home / Self Care | Payer: Self-pay | Source: Home / Self Care | Attending: Gastroenterology

## 2021-05-21 DIAGNOSIS — Z1211 Encounter for screening for malignant neoplasm of colon: Secondary | ICD-10-CM | POA: Diagnosis not present

## 2021-05-21 DIAGNOSIS — D123 Benign neoplasm of transverse colon: Secondary | ICD-10-CM | POA: Diagnosis not present

## 2021-05-21 DIAGNOSIS — I10 Essential (primary) hypertension: Secondary | ICD-10-CM | POA: Insufficient documentation

## 2021-05-21 DIAGNOSIS — K5730 Diverticulosis of large intestine without perforation or abscess without bleeding: Secondary | ICD-10-CM | POA: Diagnosis not present

## 2021-05-21 HISTORY — DX: Malignant (primary) neoplasm, unspecified: C80.1

## 2021-05-21 HISTORY — PX: COLONOSCOPY WITH PROPOFOL: SHX5780

## 2021-05-21 HISTORY — DX: Malignant neoplasm of prostate: C61

## 2021-05-21 SURGERY — COLONOSCOPY WITH PROPOFOL
Anesthesia: General

## 2021-05-21 MED ORDER — SODIUM CHLORIDE 0.9 % IV SOLN: INTRAVENOUS | Status: DC

## 2021-05-21 MED ORDER — PHENYLEPHRINE HCL (PRESSORS) 10 MG/ML IV SOLN
INTRAVENOUS | Status: DC | PRN
  Administered 2021-05-21: 100 ug via INTRAVENOUS
  Administered 2021-05-21: 50 ug via INTRAVENOUS

## 2021-05-21 MED ORDER — PROPOFOL 500 MG/50ML IV EMUL
INTRAVENOUS | Status: DC | PRN
  Administered 2021-05-21: 165 ug/kg/min via INTRAVENOUS

## 2021-05-21 MED ORDER — PROPOFOL 500 MG/50ML IV EMUL
INTRAVENOUS | Status: AC
  Filled 2021-05-21: qty 50

## 2021-05-21 MED ORDER — PROPOFOL 10 MG/ML IV BOLUS
INTRAVENOUS | Status: DC | PRN
  Administered 2021-05-21: 50 mg via INTRAVENOUS

## 2021-05-21 NOTE — Transfer of Care (Signed)
Immediate Anesthesia Transfer of Care Note  Patient: Kirk Wright  Procedure(s) Performed: COLONOSCOPY WITH PROPOFOL  Patient Location: PACU  Anesthesia Type:General  Level of Consciousness: awake and alert   Airway & Oxygen Therapy: Patient Spontanous Breathing and Patient connected to nasal cannula oxygen  Post-op Assessment: Report given to RN and Post -op Vital signs reviewed and stable  Post vital signs: Reviewed and stable  Last Vitals:  Vitals Value Taken Time  BP 110/58 05/21/21 1015  Temp 35.7 C 05/21/21 1010  Pulse 67 05/21/21 1016  Resp 25 05/21/21 1016  SpO2 100 % 05/21/21 1016  Vitals shown include unvalidated device data.  Last Pain:  Vitals:   05/21/21 1010  TempSrc: Temporal         Complications: No notable events documented.

## 2021-05-21 NOTE — Anesthesia Preprocedure Evaluation (Signed)
Anesthesia Evaluation  Patient identified by MRN, date of birth, ID band Patient awake    Reviewed: Allergy & Precautions, H&P , NPO status , Patient's Chart, lab work & pertinent test results, reviewed documented beta blocker date and time   Airway Mallampati: II   Neck ROM: full    Dental  (+) Teeth Intact   Pulmonary sleep apnea and Continuous Positive Airway Pressure Ventilation , pneumonia, COPD, former smoker,    Pulmonary exam normal        Cardiovascular Exercise Tolerance: Good hypertension, On Medications negative cardio ROS Normal cardiovascular exam Rhythm:regular Rate:Normal     Neuro/Psych negative neurological ROS  negative psych ROS   GI/Hepatic negative GI ROS, Neg liver ROS,   Endo/Other  negative endocrine ROS  Renal/GU negative Renal ROS  negative genitourinary   Musculoskeletal   Abdominal   Peds  Hematology  (+) Blood dyscrasia, anemia ,   Anesthesia Other Findings Past Medical History: No date: Anemia No date: Cancer ([place]) No date: Cataract No date: COPD (chronic obstructive pulmonary disease) ([facility]) No date: DJD (degenerative joint disease)     Comment:  shoulder No date: Elevated PSA No date: Erectile dysfunction No date: Hypertension     Comment:  controlled on meds No date: Neutropenia ([facility]) No date: Obesity No date: Obesity No date: Pneumonia No date: POAG (primary open-angle glaucoma) No date: Prostate cancer ([place]) No date: Pseudophakia of both eyes No date: Sickle cell trait ([place]) No date: Sleep apnea     Comment:  not using CPAP No date: Vitamin B 12 deficiency No date: Wears partial dentures     Comment:  upper and lower Past Surgical History: No date: COLONOSCOPY     Comment:  2016/2017 02/23/2015: COLONOSCOPY WITH PROPOFOL; N/A     Comment:  Procedure: COLONOSCOPY WITH PROPOFOL;  Surgeon: [name], MD;  Location[facility] ENDOSCOPY;  Service:                Endoscopy;  Laterality: N/A; No date: EYE SURGERY No date: GLAUCOMA SURGERY No date: HERNIA REPAIR; Right 08/15/2019: HYDROCELE EXCISION; Right     Comment:  Procedure: HYDROCELECTOMY ADULT;  Surgeon: Hollice Espy, MD;  Location: [facility] ORS;  Service: Urology;                Laterality: Right; No date: PROSTRATE BX 12/02/2019: SHOULDER ARTHROSCOPY WITH ROTATOR CUFF REPAIR AND OPEN  BICEPS TENODESIS; Right     Comment:  Procedure: RIGHT SHOULDER ARTHROSCOPY VS MINI OPEN               ROTATOR CUFF REPAIR, SUBACROMIAL DECOMPRESSION AND BICEPS              TENODESIS;  Surgeon: Leim Fabry, MD;  Location: [place] [facility];  Service: Orthopedics;  Laterality: Right; No date: TRABECULECTOMY     Comment:  with scarring BMI    Body Mass Index: 29.01 kg/m     Reproductive/Obstetrics negative OB ROS                             Anesthesia Physical Anesthesia Plan  ASA: 3  Anesthesia Plan: General   Post-op Pain Management:    Induction:   PONV Risk  Score and Plan:   Airway Management Planned:   Additional Equipment:   Intra-op Plan:   Post-operative Plan:   Informed Consent: I have reviewed the patients History and Physical, chart, labs and discussed the procedure including the risks, benefits and alternatives for the proposed anesthesia with the patient or authorized representative who has indicated his/her understanding and acceptance.     Dental Advisory Given  Plan Discussed with: CRNA  Anesthesia Plan Comments:         Anesthesia Quick Evaluation

## 2021-05-21 NOTE — Anesthesia Postprocedure Evaluation (Signed)
Anesthesia Post Note  Patient: Kirk Wright  Procedure(s) Performed: COLONOSCOPY WITH PROPOFOL  Patient location during evaluation: PACU Anesthesia Type: General Level of consciousness: awake and alert, oriented and patient cooperative Pain management: pain level controlled Vital Signs Assessment: post-procedure vital signs reviewed and stable Respiratory status: spontaneous breathing, nonlabored ventilation and respiratory function stable Cardiovascular status: blood pressure returned to baseline and stable Postop Assessment: adequate PO intake Anesthetic complications: no   No notable events documented.   Last Vitals:  Vitals:   05/21/21 1010 05/21/21 1020  BP: (!) 86/44 101/64  Pulse:  70  Resp: 12 (!) 25  Temp: (!) 35.7 C   SpO2: 100% 100%    Last Pain:  Vitals:   05/21/21 1010  TempSrc: Temporal                 Darrin Nipper

## 2021-05-21 NOTE — Interval H&P Note (Signed)
History and Physical Interval Note:  05/21/2021 9:45 AM  Kirk Wright  has presented today for surgery, with the diagnosis of Personal History Adenomatous Polyps (Z86.010).  The various methods of treatment have been discussed with the patient and family. After consideration of risks, benefits and other options for treatment, the patient has consented to  Procedure(s): COLONOSCOPY WITH PROPOFOL (N/A) as a surgical intervention.  The patient's history has been reviewed, patient examined, no change in status, stable for surgery.  I have reviewed the patient's chart and labs.  Questions were answered to the patient's satisfaction.     [name]  Ok to proceed with colonoscopy

## 2021-05-21 NOTE — H&P (Signed)
Outpatient short stay form Pre-procedure 05/21/2021  Kirk Rubenstein, MD  Primary Physician: [name], NP  Reason for visit:  Surveillance colonoscopy  History of present illness:   75 y/o gentleman with history of hypertension here for surveillance colonoscopy due to adenomatous polyps on last colonoscopy done about 5 years ago. No significant abdominal surgeries. No blood thinners. No family history of GI malignancies.    Current Facility-Administered Medications:    0.9 %  sodium chloride infusion, , Intravenous, Continuous, Amin Fornwalt, Hilton Cork, MD  Medications Prior to Admission  Medication Sig Dispense Refill Last Dose   albuterol (VENTOLIN HFA) 108 (90 Base) MCG/ACT inhaler Inhale 2 puffs into the lungs every 6 (six) hours as needed for wheezing or shortness of breath.    Past Month   aspirin EC 81 MG tablet Take 81 mg by mouth daily. Swallow whole.   Past Week   bimatoprost (LUMIGAN) 0.03 % ophthalmic solution Place 1 drop into both eyes at bedtime.    05/20/2021   brimonidine-timolol (COMBIGAN) 0.2-0.5 % ophthalmic solution Place 1 drop into both eyes every 12 (twelve) hours.   05/20/2021   budesonide-formoterol (SYMBICORT) 160-4.5 MCG/ACT inhaler Inhale 2 puffs into the lungs in the morning and at bedtime.    05/21/2021   Cyanocobalamin (VITAMIN B-12) 5000 MCG TBDP Take 5,000 mcg by mouth daily.   Past Week   ferrous sulfate 325 (65 FE) MG tablet Take 325 mg by mouth daily with breakfast.   Past Week   lisinopril-hydrochlorothiazide (ZESTORETIC) 20-25 MG tablet Take 1 tablet by mouth daily.   05/21/2021   Propylene Glycol (SYSTANE BALANCE) 0.6 % SOLN Place 1 drop into both eyes 3 (three) times daily as needed (dry/irriated eyes.).   05/20/2021   tamsulosin (FLOMAX) 0.4 MG CAPS capsule Take 0.4 mg by mouth daily.   5 05/20/2021   ondansetron (ZOFRAN ODT) 4 MG disintegrating tablet Take 1 tablet (4 mg total) by mouth every 8 (eight) hours as needed for nausea or  vomiting. 20 tablet 0      Allergies  Allergen Reactions   Contrast Media [Iodinated Diagnostic Agents] Rash     Past Medical History:  Diagnosis Date   Anemia    Cancer (HCC)    Cataract    COPD (chronic obstructive pulmonary disease) (HCC)    DJD (degenerative joint disease)    shoulder   Elevated PSA    Erectile dysfunction    Hypertension    controlled on meds   Neutropenia (HCC)    Obesity    Obesity    Pneumonia    POAG (primary open-angle glaucoma)    Prostate cancer ([place])    Pseudophakia of both eyes    Sickle cell trait (HCC)    Sleep apnea    not using CPAP   Vitamin B 12 deficiency    Wears partial dentures    upper and lower    Review of systems:  Otherwise negative.    Physical Exam  Gen: Alert, oriented. Appears stated age.  HEENT: PERRLA. Lungs: No respiratory distress CV: RRR Abd: soft, benign, no masses Ext: No edema    Planned procedures: Proceed with colonoscopy. The patient understands the nature of the planned procedure, indications, risks, alternatives and potential complications including but not limited to bleeding, infection, perforation, damage to internal organs and possible oversedation/side effects from anesthesia. The patient agrees and gives consent to proceed.  Please refer to procedure notes for findings, recommendations and patient disposition/instructions.  Kirk Rubenstein, MD [facility]

## 2021-05-21 NOTE — Op Note (Signed)
[facility] Patient Name: Kirk Wright Procedure Date: 05/21/2021 9:32 AM MRN: [id number] Account #: [id number] Date of Birth: [birth year] Admit Type: Outpatient Age: 75 Room: [facility] ENDO ROOM 1 Gender: Male Note Status: Finalized Instrument Name: Park Meo [id number] Procedure:             Colonoscopy Indications:           Surveillance: Personal history of adenomatous polyps                         on last colonoscopy 5 years ago Providers:             Andrey Farmer MD, MD Referring MD:          Juluis Rainier (Referring MD) Medicines:             Monitored Anesthesia Care Complications:         No immediate complications. Estimated blood loss:                         Minimal. Procedure:             Pre-Anesthesia Assessment:                        - Prior to the procedure, a History and Physical was                         performed, and patient medications and allergies were                         reviewed. The patient is competent. The risks and                         benefits of the procedure and the sedation options and                         risks were discussed with the patient. All questions                         were answered and informed consent was obtained.                         Patient identification and proposed procedure were                         verified by the physician, the nurse, the anesthetist                         and the technician in the endoscopy suite. Mental                         Status Examination: alert and oriented. Airway                         Examination: normal oropharyngeal airway and neck                         mobility. Respiratory Examination: clear to  auscultation. CV Examination: normal. Prophylactic                         Antibiotics: The patient does not require prophylactic                         antibiotics. Prior Anticoagulants: The patient has                          taken no previous anticoagulant or antiplatelet                         agents. ASA Grade Assessment: II - A patient with mild                         systemic disease. After reviewing the risks and                         benefits, the patient was deemed in satisfactory                         condition to undergo the procedure. The anesthesia                         plan was to use monitored anesthesia care (MAC).                         Immediately prior to administration of medications,                         the patient was re-assessed for adequacy to receive                         sedatives. The heart rate, respiratory rate, oxygen                         saturations, blood pressure, adequacy of pulmonary                         ventilation, and response to care were monitored                         throughout the procedure. The physical status of the                         patient was re-assessed after the procedure.                        After obtaining informed consent, the colonoscope was                         passed under direct vision. Throughout the procedure,                         the patient's blood pressure, pulse, and oxygen                         saturations were monitored continuously. The  Colonoscope was introduced through the anus and                         advanced to the the cecum, identified by appendiceal                         orifice and ileocecal valve. The colonoscopy was                         somewhat difficult due to significant looping.                         Successful completion of the procedure was aided by                         applying abdominal pressure. The patient tolerated the                         procedure well. The quality of the bowel preparation                         was good. Findings:      The perianal and digital rectal examinations were normal.      A 2 mm polyp was found in the  transverse colon. The polyp was sessile.       The polyp was removed with a jumbo cold forceps. Resection and retrieval       were complete. Estimated blood loss was minimal.      Multiple small-mouthed diverticula were found in the sigmoid colon,       descending colon, transverse colon and hepatic flexure.      The exam was otherwise without abnormality on direct and retroflexion       views. Impression:            - One 2 mm polyp in the transverse colon, removed with                         a jumbo cold forceps. Resected and retrieved.                        - Diverticulosis in the sigmoid colon, in the                         descending colon, in the transverse colon and at the                         hepatic flexure.                        - The examination was otherwise normal on direct and                         retroflexion views. Recommendation:        - Discharge patient to home.                        - Resume previous diet.                        -  Continue present medications.                        - Await pathology results.                        - Repeat colonoscopy is not recommended due to current                         age (75 years or older) for surveillance.                        - Return to referring physician as previously                         scheduled. Procedure Code(s):     --- Professional ---                        [phone], Colonoscopy, flexible; with biopsy, single or                         multiple Diagnosis Code(s):     --- Professional ---                        Z86.010, Personal history of colonic polyps                        K63.5, Polyp of colon                        K57.30, Diverticulosis of large intestine without                         perforation or abscess without bleeding CPT copyright 2019 American Medical Association. All rights reserved. The codes documented in this report are preliminary and upon coder review may  be revised to  meet current compliance requirements. Andrey Farmer MD, MD 05/21/2021 10:11:39 AM Number of Addenda: 0 Note Initiated On: 05/21/2021 9:32 AM Scope Withdrawal Time: 0 hours 7 minutes 4 seconds  Total Procedure Duration: 0 hours 13 minutes 25 seconds  Estimated Blood Loss:  Estimated blood loss was minimal.      [facility]

## 2021-05-22 ENCOUNTER — Encounter: Payer: Self-pay | Admitting: Gastroenterology

## 2021-05-22 LAB — SURGICAL PATHOLOGY

## 2021-07-07 IMAGING — MR MR SHOULDER*R* W/O CM
5 series · 32 of 40 positions shown · non-contrast
Comparison: None.

CLINICAL DATA: Shoulder injury lifting weights.

EXAM:
MRI OF THE RIGHT SHOULDER WITHOUT CONTRAST
TECHNIQUE: Multiplanar, multisequence MR imaging of the shoulder was performed.
No intravenous contrast was administered.

[Series 5: PD fat-sat · axial · right · 4.0mm · 0.55mm/px · z∈[-43,+87]mm · 8 of 28 slices shown (1 of 2)]
[im 1/28]
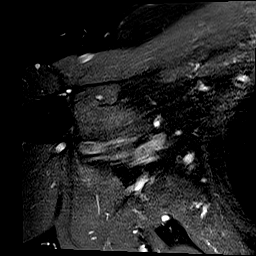
[im 4/28]
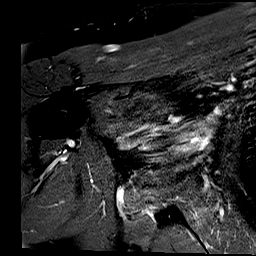
[im 10/28]
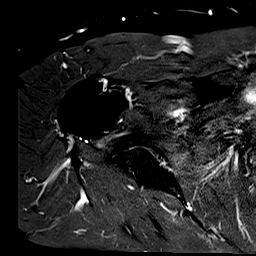
[im 13/28]
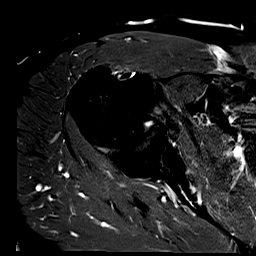
[im 16/28]
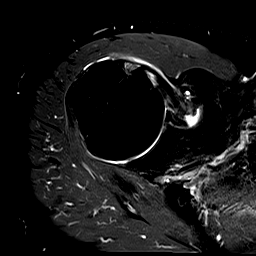
[im 19/28]
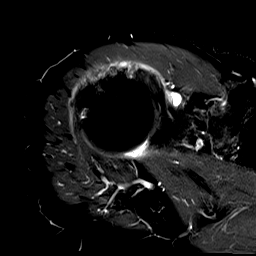
[im 25/28]
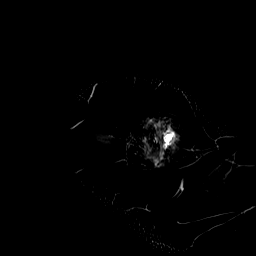
[im 28/28]
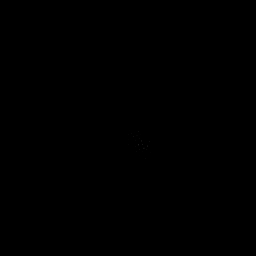

[Series 7: PD fat-sat · oblique · right · 4.0mm · 0.44mm/px · 8 of 26 slices shown (2 of 2)]
[im 1/26]
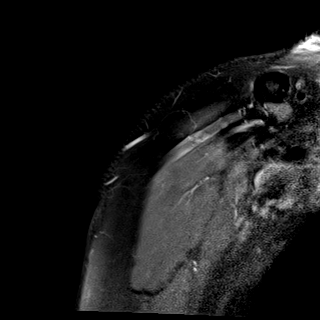
[im 4/26]
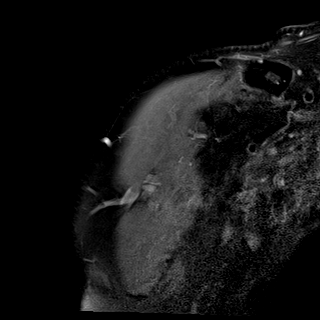
[im 8/26]
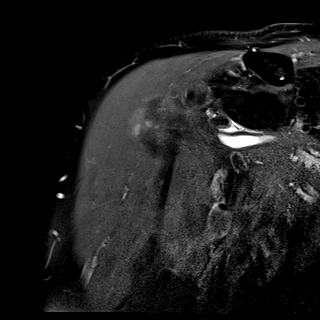
[im 11/26]
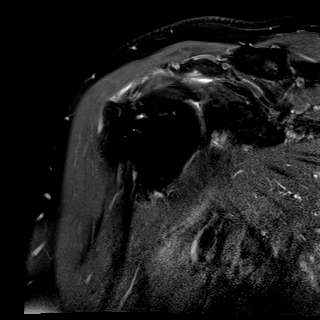
[im 15/26]
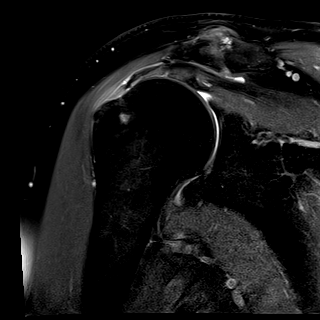
[im 18/26]
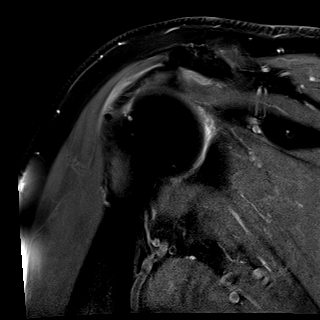
[im 22/26]
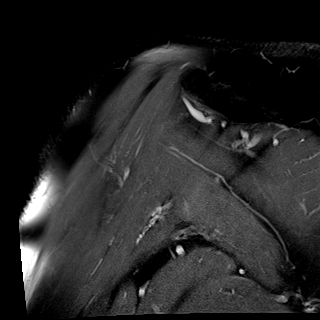
[im 26/26]
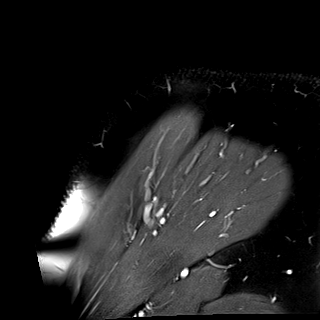

[Series 8: T2 fat-sat · oblique · right · 4.0mm · 0.44mm/px · 8 of 26 slices shown (1 of 2)]
[im 1/26]
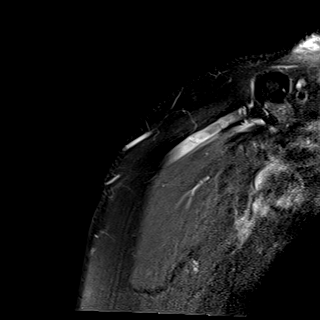
[im 4/26]
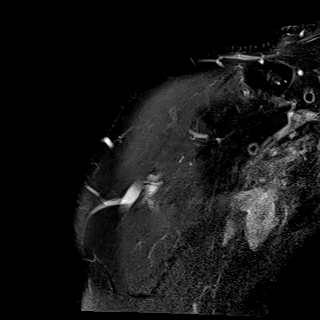
[im 8/26]
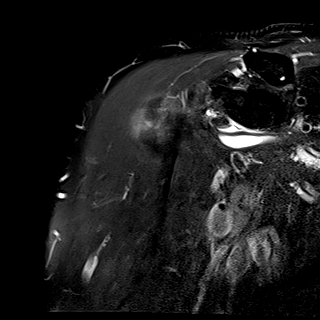
[im 11/26]
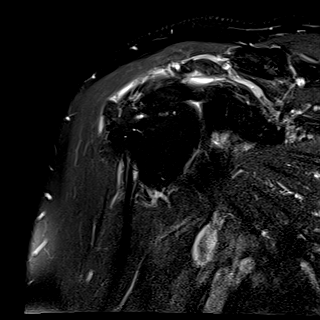
[im 15/26]
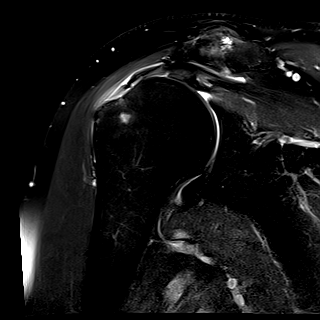
[im 18/26]
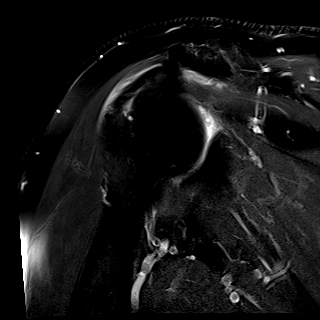
[im 22/26]
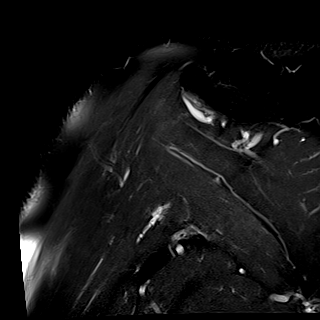
[im 26/26]
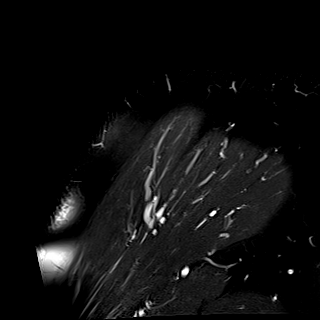

[Series 9: T2 fat-sat · oblique · right · 4.0mm · 0.23mm/px · 7 of 22 slices shown (2 of 2)]
[im 1/22]
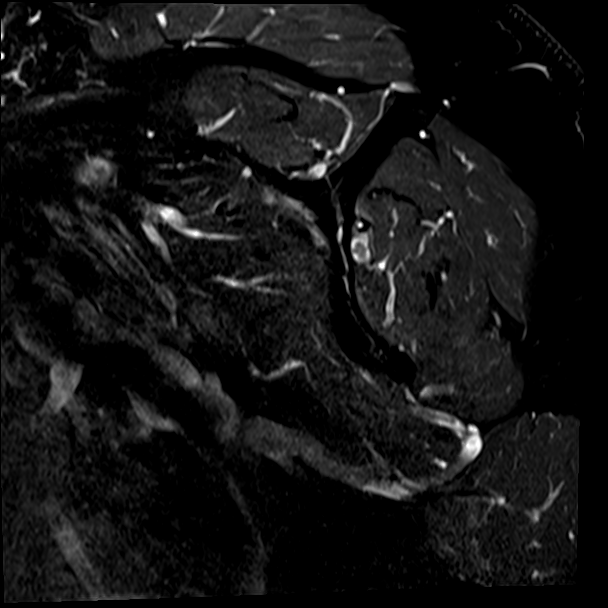
[im 4/22]
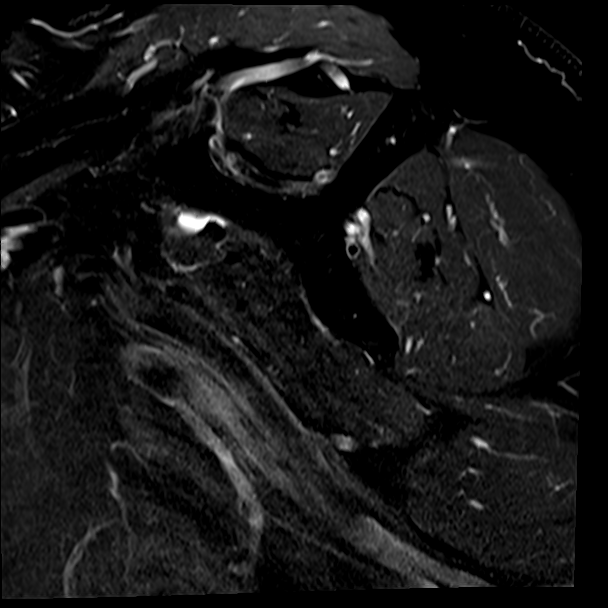
[im 8/22]
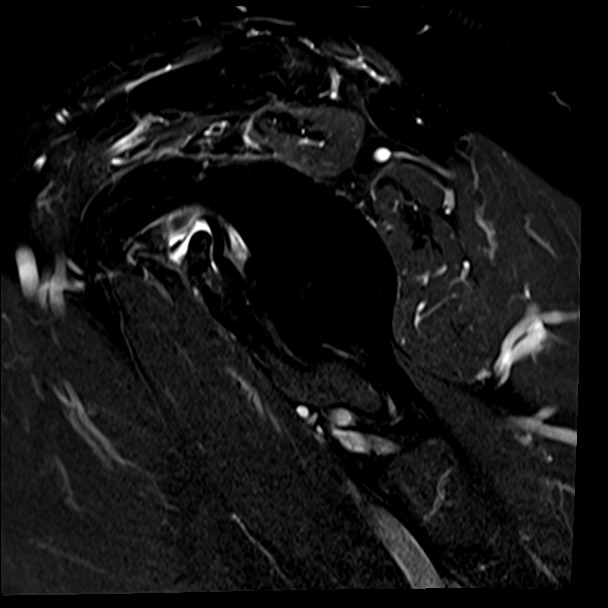
[im 11/22]
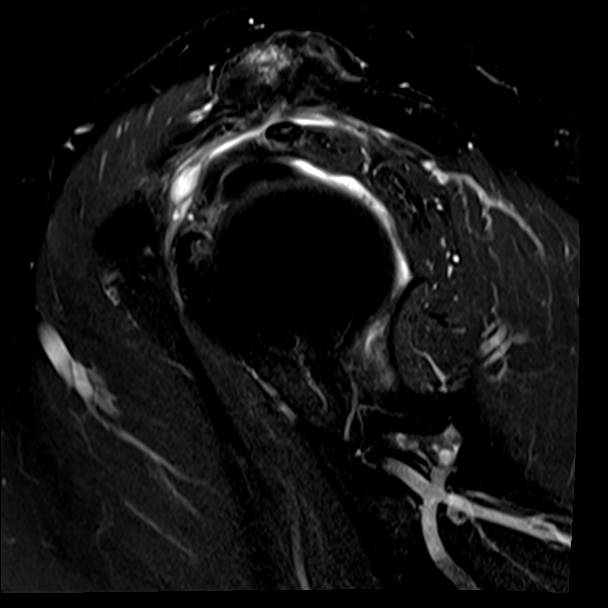
[im 15/22]
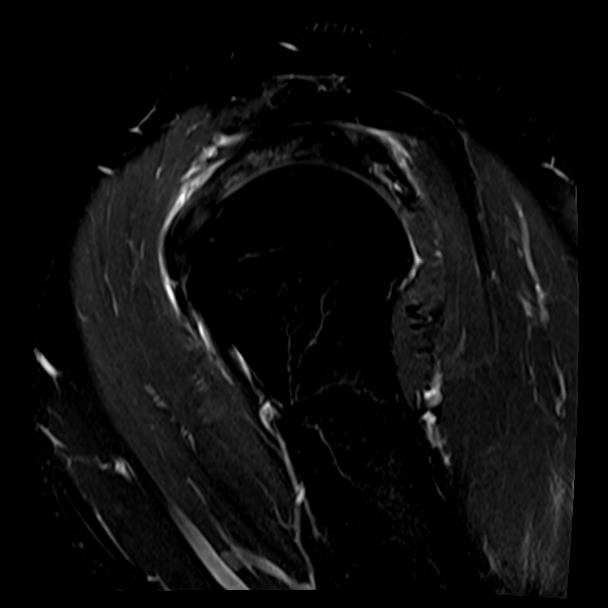
[im 18/22]
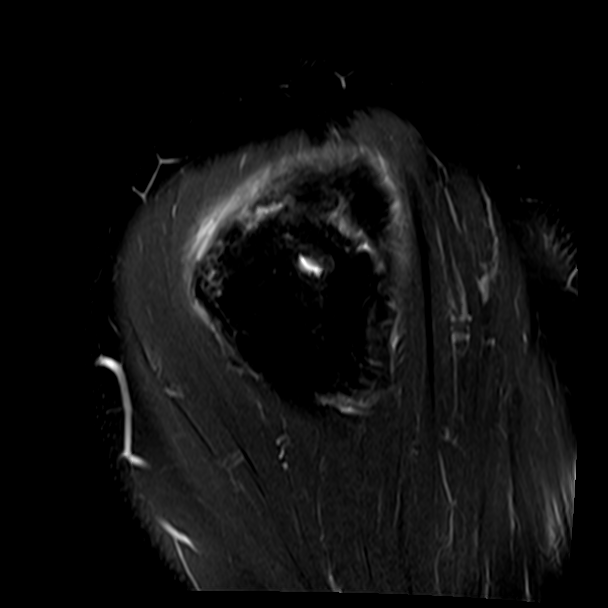
[im 22/22]
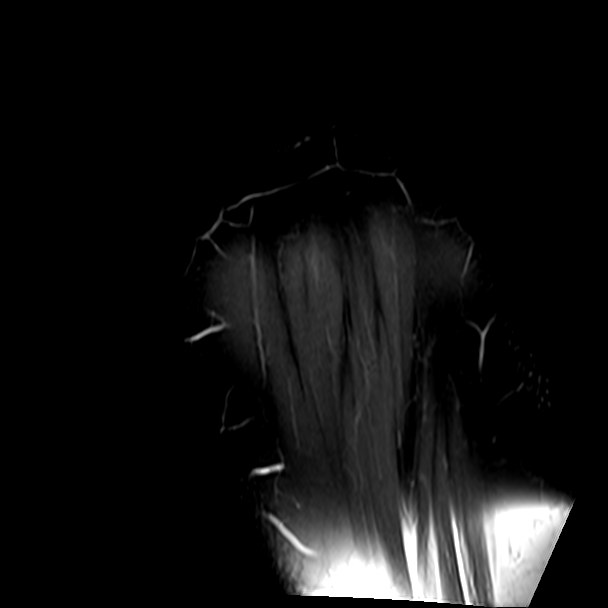

[Series 10: T1 · oblique · right · 4.0mm · 0.36mm/px · 1 of 22 slices shown]
[im 1/22]
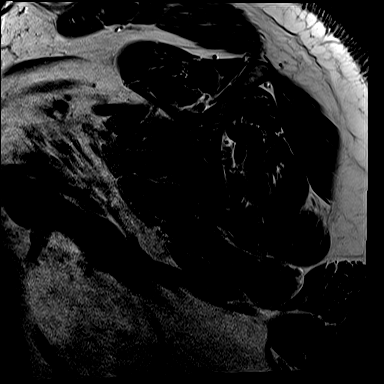

[32 of 40 positions shown; findings below may reference images not displayed]

FINDINGS: Rotator cuff: Full-thickness retracted supraspinatus tendon tear.
Maximum retraction is 14.5 mm and the tear is 15.5 mm wide. The
posterior fibers of the supraspinatus tendon are intact and the
infraspinatus tendon is intact. The subscapularis tendon is intact.

Muscles:  Mild fatty atrophy of the shoulder musculature in general.

Biceps long head:  Intact.

Acromioclavicular Joint: Moderate degenerative changes. Type 1-2 in
shape acromion. No significant lateral downsloping or undersurface
spurring.

Glenohumeral Joint: Mild degenerative changes. Small joint effusion.

Labrum:  No obvious labral tears.

Bones:  No acute bony findings.

Other: Expected fluid in the subacromial/subdeltoid bursa.
IMPRESSION: 1. Full-thickness retracted supraspinatus tendon tear as described
above.
2. Intact long head biceps tendon and grossly normal glenoid labrum.
3. Moderate AC joint degenerative changes but no other significant
findings for bony impingement.

## 2022-04-11 ENCOUNTER — Ambulatory Visit: Payer: Medicare Other | Admitting: Urology

## 2022-04-11 ENCOUNTER — Encounter: Payer: Self-pay | Admitting: Urology

## 2022-04-11 VITALS — BP 132/64 | HR 63 | Ht 71.0 in | Wt 219.0 lb

## 2022-04-11 DIAGNOSIS — R3912 Poor urinary stream: Secondary | ICD-10-CM

## 2022-04-11 DIAGNOSIS — C61 Malignant neoplasm of prostate: Secondary | ICD-10-CM

## 2022-04-11 DIAGNOSIS — N401 Enlarged prostate with lower urinary tract symptoms: Secondary | ICD-10-CM | POA: Diagnosis not present

## 2022-04-11 NOTE — Progress Notes (Signed)
04/11/2022 8:53 AM   Kirk Wright 09/21/1945 [id number]  Referring provider: [name], NP [address] [place],  [place] [address]  Chief Complaint  Patient presents with   Elevated PSA    HPI: 76 year old male with personal history of prostate cancer last follow-up he returns today with marked rise in his PSA.  Low risk prostate cancer Patient underwent a prostate biopsy on 11/14/2012 with Dr. [name] for a PSA of 6.86.  58 gram prostate.  Results were Gleason 3+3 cancer in 2/12 cores.  2% core of lateral mid and 8% of lateral apex.  He was then referred to Dr. [name] on 01/25/2013 for possible robotic prostatectomy.  He elected to defer this.  He had a repeat prostate biopsy in May 2018 which was negative for disease.  Patient chose to manage his prostate cancer with active surveillance.     Most recent imaging in the form of prostate MRI completed in 12/2017 showed no suspicious or high-grade lesions, PI-RADS 1.   Repeat MR 01/27/19 stable, no high grade lesions.  Markedly enlarged transition zone, prostomegaly 150 cc.  His PSA had stabilized in the 10 range.  Unfortunately, he was lost to follow-up and has not been seen for 2 years.  In the interim, his PSA has risen to 19.  5 8 on 03/05/2022.  He continues to be skeptical that he has prostate cancer.  BPH He reports today that he does well he takes Flomax 04 mg.  He sometimes has difficulty starting his stream pursing the morning but then it improves.  He feels like he empties his bladder.  PMH: Past Medical History:  Diagnosis Date   Anemia    Cancer ([place])    Cataract    COPD (chronic obstructive pulmonary disease) (HCC)    DJD (degenerative joint disease)    shoulder   Elevated PSA    Erectile dysfunction    Hypertension    controlled on meds   Neutropenia (HCC)    Obesity    Obesity    Pneumonia    POAG (primary open-angle glaucoma)    Prostate cancer ([place])    Pseudophakia of both eyes     Sickle cell trait ([place])    Sleep apnea    not using CPAP   Vitamin B 12 deficiency    Wears partial dentures    upper and lower    Surgical History: Past Surgical History:  Procedure Laterality Date   COLONOSCOPY     2016/2017   COLONOSCOPY WITH PROPOFOL N/A 02/23/2015   Procedure: COLONOSCOPY WITH PROPOFOL;  Surgeon: [name], MD;  Location: [facility] ENDOSCOPY;  Service: Endoscopy;  Laterality: N/A;   COLONOSCOPY WITH PROPOFOL N/A 05/21/2021   Procedure: COLONOSCOPY WITH PROPOFOL;  Surgeon: [name], MD;  Location: [facility] ENDOSCOPY;  Service: Endoscopy;  Laterality: N/A;   EYE SURGERY     GLAUCOMA SURGERY     HERNIA REPAIR Right    HYDROCELE EXCISION Right 08/15/2019   Procedure: HYDROCELECTOMY ADULT;  Surgeon: Hollice Espy, MD;  Location: [facility] ORS;  Service: Urology;  Laterality: Right;   PROSTRATE BX     SHOULDER ARTHROSCOPY WITH ROTATOR CUFF REPAIR AND OPEN BICEPS TENODESIS Right 12/02/2019   Procedure: RIGHT SHOULDER ARTHROSCOPY VS MINI OPEN ROTATOR CUFF REPAIR, SUBACROMIAL DECOMPRESSION AND BICEPS TENODESIS;  Surgeon: Leim Fabry, MD;  Location: [place];  Service: Orthopedics;  Laterality: Right;   TRABECULECTOMY     with scarring    Home  Medications:  Allergies as of 04/11/2022       Reactions   Contrast Media [iodinated Contrast Media] Rash        Medication List        Accurate as of April 11, 2022  8:53 AM. If you have any questions, ask your nurse or doctor.          STOP taking these medications    ondansetron 4 MG disintegrating tablet Commonly known as: Zofran ODT Stopped by: Hollice Espy, MD       TAKE these medications    albuterol 108 (90 Base) MCG/ACT inhaler Commonly known as: VENTOLIN HFA Inhale 2 puffs into the lungs every 6 (six) hours as needed for wheezing or shortness of breath.   aspirin EC 81 MG tablet Take 81 mg by mouth daily. Swallow whole.   bimatoprost 0.03 % ophthalmic  solution Commonly known as: LUMIGAN Place 1 drop into both eyes at bedtime.   brimonidine-timolol 0.2-0.5 % ophthalmic solution Commonly known as: COMBIGAN Place 1 drop into both eyes every 12 (twelve) hours.   budesonide-formoterol 160-4.5 MCG/ACT inhaler Commonly known as: SYMBICORT Inhale 2 puffs into the lungs in the morning and at bedtime.   ferrous sulfate 325 (65 FE) MG tablet Take 325 mg by mouth daily with breakfast.   lisinopril-hydrochlorothiazide 20-25 MG tablet Commonly known as: ZESTORETIC Take 1 tablet by mouth daily.   Systane Balance 0.6 % Soln Generic drug: Propylene Glycol Place 1 drop into both eyes 3 (three) times daily as needed (dry/irriated eyes.).   tamsulosin 0.4 MG Caps capsule Commonly known as: FLOMAX Take 0.4 mg by mouth daily.   Vitamin B-12 5000 MCG Tbdp Take 5,000 mcg by mouth daily.        Allergies:  Allergies  Allergen Reactions   Contrast Media [Iodinated Contrast Media] Rash    Family History: Family History  Problem Relation Age of Onset   Kidney cancer Neg Hx    Kidney disease Neg Hx    Prostate cancer Neg Hx    Bladder Cancer Neg Hx     Social History:  reports that he has quit smoking. His smoking use included cigarettes. He has never used smokeless tobacco. He reports that he does not drink alcohol and does not use drugs.   Physical Exam: BP 132/64   Pulse 63   Ht '5\' 11"'$  (1.803 m)   Wt 219 lb (99.3 kg)   BMI 30.54 kg/m   Constitutional:  Alert and oriented, No acute distress. HEENT: [place] AT, moist mucus membranes.  Trachea midline, no masses. Cardiovascular: No clubbing, cyanosis, or edema. Respiratory: Normal respiratory effort, no increased work of breathing. GI: Abdomen is soft, nontender, nondistended, no abdominal masses Rectal exam: Normal sphincter tone.  Enlarged prostate with a firm nodule at the left apex, unable to palpate base due to the size of the gland. Skin: No rashes, bruises or suspicious  lesions. Neurologic: Grossly intact, no focal deficits, moving all 4 extremities. Psychiatric: Normal mood and affect.  Laboratory Data: Lab Results  Component Value Date   WBC 5.5 08/11/2019   HGB 12.3 (L) 08/11/2019   HCT 37.1 (L) 08/11/2019   MCV 88.1 08/11/2019   PLT 202 08/11/2019    Lab Results  Component Value Date   CREATININE 1.61 (H) 08/11/2019     Assessment & Plan:    1. Prostate CA [facility]) Personal history of previously diagnosed low risk prostate cancer lost to follow-up  In the interim, PSA has doubled  and his exam today is concerning  I recommended starting with MRI especially given this volume of his prostate as he will likely need a fusion biopsy if there is any abnormality.  He is agreeable with this plan.  We discussed the importance of follow-up and follow through with this issue.  Concern for disease progression. - MR PROSTATE W WO CONTRAST; Future  2. Benign prostatic hyperplasia with weak urinary stream Continue Flomax   Return in about 4 weeks (around 05/09/2022) for MRI results.  Hollice Espy, MD  [facility] [address] [place], [place] [address] [phone]

## 2022-04-29 ENCOUNTER — Ambulatory Visit
Admission: RE | Admit: 2022-04-29 | Discharge: 2022-04-29 | Disposition: A | Discharge status: Home / Self Care | Payer: Medicare Other | Source: Ambulatory Visit | Attending: Urology | Admitting: Urology

## 2022-04-29 DIAGNOSIS — C61 Malignant neoplasm of prostate: Secondary | ICD-10-CM | POA: Insufficient documentation

## 2022-04-29 MED ORDER — GADOBUTROL 1 MMOL/ML IV SOLN
9.0000 mL | Freq: Once | INTRAVENOUS | Status: AC | PRN
  Administered 2022-04-29: 9 mL via INTRAVENOUS

## 2022-05-09 ENCOUNTER — Ambulatory Visit: Payer: Medicare Other | Admitting: Urology

## 2022-05-09 ENCOUNTER — Encounter: Payer: Self-pay | Admitting: Urology

## 2022-05-09 VITALS — BP 120/54 | HR 68 | Ht 71.0 in | Wt 221.0 lb

## 2022-05-09 DIAGNOSIS — C61 Malignant neoplasm of prostate: Secondary | ICD-10-CM

## 2022-05-09 DIAGNOSIS — N401 Enlarged prostate with lower urinary tract symptoms: Secondary | ICD-10-CM | POA: Diagnosis not present

## 2022-05-09 DIAGNOSIS — R3912 Poor urinary stream: Secondary | ICD-10-CM | POA: Diagnosis not present

## 2022-05-09 NOTE — Progress Notes (Signed)
05/09/2022 9:10 AM   Kirk Wright 03-03-1946 [id number]  Referring provider: [name], NP [address] [place],  [place] [address]  Chief Complaint  Patient presents with   Results    MRI    HPI:    PMH: Past Medical History:  Diagnosis Date   Anemia    Cancer ([place])    Cataract    COPD (chronic obstructive pulmonary disease) ([place])    DJD (degenerative joint disease)    shoulder   Elevated PSA    Erectile dysfunction    Hypertension    controlled on meds   Neutropenia ([facility])    Obesity    Obesity    Pneumonia    POAG (primary open-angle glaucoma)    Prostate cancer ([place])    Pseudophakia of both eyes    Sickle cell trait ([place])    Sleep apnea    not using CPAP   Vitamin B 12 deficiency    Wears partial dentures    upper and lower    Surgical History: Past Surgical History:  Procedure Laterality Date   COLONOSCOPY     2016/2017   COLONOSCOPY WITH PROPOFOL N/A 02/23/2015   Procedure: COLONOSCOPY WITH PROPOFOL;  Surgeon: [name], MD;  [facility] END[facility]COPY;  Service: Endoscopy;  Laterality: N/A;   COLONOSCOPY WITH PROPOFOL N/A 05/21/2021   Procedure: COLONOSCOPY WITH PROPOFOL;  Surgeon: [name], MD;  Location: [facility] ENDOSCOPY;  Service: Endoscopy;  Laterality: N/A;   EYE SURGERY     GLAUCOMA SURGERY     HERNIA REPAIR Right    HYDROCELE EXCISION Right 08/15/2019   Procedure: HYDROCELECTOMY ADULT;  Surgeon: Hollice Espy, MD;  Location: [facility] ORS;  Service: Urology;  Laterality: Right;   PROSTRATE BX     SHOULDER ARTHROSCOPY WITH ROTATOR CUFF REPAIR AND OPEN BICEPS TENODESIS Right 12/02/2019   Procedure: RIGHT SHOULDER ARTHROSCOPY VS MINI OPEN ROTATOR CUFF REPAIR, SUBACROMIAL DECOMPRESSION AND BICEPS TENODESIS;  Surgeon: Leim Fabry, MD;  Location: [place];  Service: Orthopedics;  Laterality: Right;   TRABECULECTOMY     with scarring    Home Medications:  Allergies as of 05/09/2022        Reactions   Contrast Media [iodinated Contrast Media] Rash        Medication List        Accurate as of May 09, 2022  9:10 AM. If you have any questions, ask your nurse or doctor.          albuterol 108 (90 Base) MCG/ACT inhaler Commonly known as: VENTOLIN HFA Inhale 2 puffs into the lungs every 6 (six) hours as needed for wheezing or shortness of breath.   aspirin EC 81 MG tablet Take 81 mg by mouth daily. Swallow whole.   bimatoprost 0.03 % ophthalmic solution Commonly known as: LUMIGAN Place 1 drop into both eyes at bedtime.   brimonidine-timolol 0.2-0.5 % ophthalmic solution Commonly known as: COMBIGAN Place 1 drop into both eyes every 12 (twelve) hours.   budesonide-formoterol 160-4.5 MCG/ACT inhaler Commonly known as: SYMBICORT Inhale 2 puffs into the lungs in the morning and at bedtime.   ferrous sulfate 325 (65 FE) MG tablet Take 325 mg by mouth daily with breakfast.   lisinopril-hydrochlorothiazide 20-25 MG tablet Commonly known as: ZESTORETIC Take 1 tablet by mouth daily.   Systane Balance 0.6 % Soln Generic drug: Propylene Glycol Place 1 drop into both eyes 3 (three) times daily as needed (dry/irriated eyes.).   tamsulosin 0.4 MG  Caps capsule Commonly known as: FLOMAX Take 0.4 mg by mouth daily.   Vitamin B-12 5000 MCG Tbdp Take 5,000 mcg by mouth daily.        Allergies:  Allergies  Allergen Reactions   Contrast Media [Iodinated Contrast Media] Rash    Family History: Family History  Problem Relation Age of Onset   Kidney cancer Neg Hx    Kidney disease Neg Hx    Prostate cancer Neg Hx    Bladder Cancer Neg Hx     Social History:  reports that he has quit smoking. His smoking use included cigarettes. He has never used smokeless tobacco. He reports that he does not drink alcohol and does not use drugs.   Physical Exam: BP (!) 120/54 (BP Location: Left Arm, Patient Position: Sitting, Cuff Size: Normal)   Pulse 68   Ht '5\' 11"'$   (1.803 m)   Wt 221 lb (100.2 kg)   BMI 30.82 kg/m   Constitutional:  Alert and oriented, No acute distress. HEENT: [place] AT, moist mucus membranes.  Trachea midline, no masses. Cardiovascular: No clubbing, cyanosis, or edema. Respiratory: Normal respiratory effort, no increased work of breathing. GI: Abdomen is soft, nontender, nondistended, no abdominal masses GU: No CVA tenderness Skin: No rashes, bruises or suspicious lesions. Neurologic: Grossly intact, no focal deficits, moving all 4 extremities. Psychiatric: Normal mood and affect.  Laboratory Data: Lab Results  Component Value Date   WBC 5.5 08/11/2019   HGB 12.3 (L) 08/11/2019   HCT 37.1 (L) 08/11/2019   MCV 88.1 08/11/2019   PLT 202 08/11/2019    Lab Results  Component Value Date   CREATININE 1.61 (H) 08/11/2019    Lab Results  Component Value Date   PSA 8.81 (H) 09/26/2016    No results found for: "TESTOSTERONE"  No results found for: "HGBA1C"  Urinalysis No results found for: "COLORURINE", "APPEARANCEUR", "LABSPEC", "PHURINE", "GLUCOSEU", "HGBUR", "BILIRUBINUR", "KETONESUR", "PROTEINUR", "UROBILINOGEN", "NITRITE", "LEUKOCYTESUR"  No results found for: "LABMICR", "WBCUA", "RBCUA", "LABEPIT", "MUCUS", "BACTERIA"  Pertinent Imaging: *** No results found for this or any previous visit.  No results found for this or any previous visit.  No results found for this or any previous visit.  No results found for this or any previous visit.  No results found for this or any previous visit.  No valid procedures specified. No results found for this or any previous visit.  No results found for this or any previous visit.   Assessment & Plan:    There are no diagnoses linked to this encounter.  No follow-ups on file.  Hollice Espy, MD  [facility] [address] [place], [place] [address] [phone]

## 2022-08-13 ENCOUNTER — Other Ambulatory Visit: Payer: Self-pay | Admitting: Nephrology

## 2022-08-13 DIAGNOSIS — N184 Chronic kidney disease, stage 4 (severe): Secondary | ICD-10-CM

## 2022-08-20 ENCOUNTER — Ambulatory Visit
Admission: RE | Admit: 2022-08-20 | Discharge: 2022-08-20 | Disposition: A | Discharge status: Home / Self Care | Payer: Medicare Other | Source: Ambulatory Visit | Attending: Nephrology | Admitting: Nephrology

## 2022-08-20 DIAGNOSIS — N184 Chronic kidney disease, stage 4 (severe): Secondary | ICD-10-CM | POA: Insufficient documentation

## 2022-10-10 ENCOUNTER — Ambulatory Visit: Payer: Medicare Other | Admitting: Urology

## 2022-10-29 ENCOUNTER — Other Ambulatory Visit
Admission: RE | Admit: 2022-10-29 | Discharge: 2022-10-29 | Disposition: A | Discharge status: Home / Self Care | Payer: Medicare Other | Attending: Urology | Admitting: Urology

## 2022-10-29 DIAGNOSIS — C61 Malignant neoplasm of prostate: Secondary | ICD-10-CM | POA: Diagnosis present

## 2022-10-29 LAB — PSA: Prostatic Specific Antigen: 18.01 ng/mL — ABNORMAL HIGH (ref 0.00–4.00)

## 2022-10-31 ENCOUNTER — Ambulatory Visit: Payer: Medicare Other | Admitting: Urology

## 2022-10-31 VITALS — BP 116/61 | HR 62 | Ht 71.0 in | Wt 221.1 lb

## 2022-10-31 DIAGNOSIS — N401 Enlarged prostate with lower urinary tract symptoms: Secondary | ICD-10-CM | POA: Diagnosis not present

## 2022-10-31 DIAGNOSIS — C61 Malignant neoplasm of prostate: Secondary | ICD-10-CM | POA: Diagnosis not present

## 2022-10-31 DIAGNOSIS — R3912 Poor urinary stream: Secondary | ICD-10-CM | POA: Diagnosis not present

## 2022-10-31 NOTE — Patient Instructions (Signed)
Lab visit in [place], 3-5 days before your one year follow-up with Dr.Brandon

## 2022-10-31 NOTE — Progress Notes (Signed)
I, Amy L Pierron, acting as a scribe for Vanna Scotland, MD.,have documented all relevant documentation on the behalf of Vanna Scotland, MD,as directed by  Vanna Scotland, MD while in the presence of Vanna Scotland, MD.  10/31/2022 10:26 PM   Kirk Wright 09-Aug-1945 [id number]  Referring provider: Myrene Buddy, NP [address] [place],  [place] [address]  Chief Complaint  Patient presents with   Prostate Cancer   Benign Prostatic Hypertrophy    HPI: 77 year old male with a personal history prostate cancer on active surveillance who returns today for a six month follow-up.   Patient underwent a prostate biopsy on 11/14/2012 with Dr. Edwyna Shell for a PSA of 6.86.  58 gram prostate.  Results were Gleason 3+3 cancer in 2/12 cores.  2% core of lateral mid and 8% of lateral apex.  He was then referred to Dr. Vladimir Creeks on 01/25/2013 for possible robotic prostatectomy.  He elected to defer this.  He had a repeat prostate biopsy in May 2018 which was negative for disease.  Patient chose to manage his prostate cancer with active surveillance.     Most recent imaging in the form of prostate MRI completed in 12/2017 showed no suspicious or high-grade lesions, PI-RADS 1.   Repeat MR 01/27/19 stable, no high grade lesions.  Markedly enlarged transition zone, prostomegaly 150 cc.  His PSA had stabilized in the 10 range.  Unfortunately, he was lost to follow-up and has not been seen for 2 years.  In the interim, his PSA has risen to 19 on 03/05/2022.   Prostate MRI shows prostamegaly, along with a large median lobe indenting the base and he is a low T2 signal in the peripheral zone which is nonfocal, PI-RADS 2.  His most recent PSA from 10/29/22 is 18.01 which is essentially stable from his PSA on 03/05/2022 at 19.59.  He is doing well overall. He reports having a weak stream in the morning. He feels he is emptying his bladder. He drinks a lot of water and voids frequently because of that.     PMH: Past Medical History:  Diagnosis Date   Anemia    Cancer (HCC)    Cataract    COPD (chronic obstructive pulmonary disease) (HCC)    DJD (degenerative joint disease)    shoulder   Elevated PSA    Erectile dysfunction    Hypertension    controlled on meds   Neutropenia (HCC)    Obesity    Obesity    Pneumonia    POAG (primary open-angle glaucoma)    Prostate cancer (HCC)    Pseudophakia of both eyes    Sickle cell trait (HCC)    Sleep apnea    not using CPAP   Vitamin B 12 deficiency    Wears partial dentures    upper and lower    Surgical History: Past Surgical History:  Procedure Laterality Date   COLONOSCOPY     2016/2017   COLONOSCOPY WITH PROPOFOL N/A 02/23/2015   Procedure: COLONOSCOPY WITH PROPOFOL;  Surgeon: Christena Deem, MD;  Location: [facility] ENDOSCOPY;  Service: Endoscopy;  Laterality: N/A;   COLONOSCOPY WITH PROPOFOL N/A 05/21/2021   Procedure: COLONOSCOPY WITH PROPOFOL;  Surgeon: Regis Bill, MD;  Location: [facility] ENDOSCOPY;  Service: Endoscopy;  Laterality: N/A;   EYE SURGERY     GLAUCOMA SURGERY     HERNIA REPAIR Right    HYDROCELE EXCISION Right 08/15/2019   Procedure: HYDROCELECTOMY ADULT;  Surgeon: Vanna Scotland, MD;  Location: [facility] ORS;  Service: Urology;  Laterality: Right;   PROSTRATE BX     SHOULDER ARTHROSCOPY WITH ROTATOR CUFF REPAIR AND OPEN BICEPS TENODESIS Right 12/02/2019   Procedure: RIGHT SHOULDER ARTHROSCOPY VS MINI OPEN ROTATOR CUFF REPAIR, SUBACROMIAL DECOMPRESSION AND BICEPS TENODESIS;  Surgeon: Signa Kell, MD;  Location: [facility];  Service: Orthopedics;  Laterality: Right;   TRABECULECTOMY     with scarring    Home Medications:  Allergies as of 10/31/2022       Reactions   Contrast Media [iodinated Contrast Media] Rash        Medication List        Accurate as of Oct 31, 2022 10:26 PM. If you have any questions, ask your nurse or doctor.          albuterol 108 (90 Base) MCG/ACT  inhaler Commonly known as: VENTOLIN HFA Inhale 2 puffs into the lungs every 6 (six) hours as needed for wheezing or shortness of breath.   aspirin EC 81 MG tablet Take 81 mg by mouth daily. Swallow whole.   bimatoprost 0.03 % ophthalmic solution Commonly known as: LUMIGAN Place 1 drop into both eyes at bedtime.   brimonidine-timolol 0.2-0.5 % ophthalmic solution Commonly known as: COMBIGAN Place 1 drop into both eyes every 12 (twelve) hours.   budesonide-formoterol 160-4.5 MCG/ACT inhaler Commonly known as: SYMBICORT Inhale 2 puffs into the lungs in the morning and at bedtime.   calcitRIOL 0.25 MCG capsule Commonly known as: ROCALTROL Take 0.25 mcg by mouth daily.   ferrous sulfate 325 (65 FE) MG tablet Take 325 mg by mouth daily with breakfast.   lisinopril-hydrochlorothiazide 20-25 MG tablet Commonly known as: ZESTORETIC Take 1 tablet by mouth daily.   rosuvastatin 5 MG tablet Commonly known as: CRESTOR Take 5 mg by mouth daily.   Systane Balance 0.6 % Soln Generic drug: Propylene Glycol Place 1 drop into both eyes 3 (three) times daily as needed (dry/irriated eyes.).   tamsulosin 0.4 MG Caps capsule Commonly known as: FLOMAX Take 0.4 mg by mouth daily.   Vitamin B-12 5000 MCG Tbdp Take 5,000 mcg by mouth daily.        Allergies:  Allergies  Allergen Reactions   Contrast Media [Iodinated Contrast Media] Rash    Family History: Family History  Problem Relation Age of Onset   Kidney cancer Neg Hx    Kidney disease Neg Hx    Prostate cancer Neg Hx    Bladder Cancer Neg Hx     Social History:  reports that he has quit smoking. His smoking use included cigarettes. He has never used smokeless tobacco. He reports that he does not drink alcohol and does not use drugs.   Physical Exam: BP 116/61   Pulse 62   Ht 5\' 11"  (1.803 m)   Wt 221 lb 2 oz (100.3 kg)   BMI 30.84 kg/m   Constitutional:  Alert and oriented, No acute distress. HEENT: [place] AT, moist  mucus membranes.  Trachea midline, no masses. Neurologic: Grossly intact, no focal deficits, moving all 4 extremities. Psychiatric: Normal mood and affect.   Assessment & Plan:    Prostate cancer  -  Low risk. He had a fairly reassuring prostate MRI. His PSA is essentially stable. He's remains hesitant to undergo repeat biopsy. We'll talk to him about whether he wants to pursue that at this point in time. Plan to continue to monitor his PSA every 6 months to ensure that it stays stable.  2.  BPH with weak stream  - Symptoms managed well. Continue Flomax.   Return in about 6 months (around 05/03/2023) for PSA.  [facility] [address], [address] [place], [place] [address] [phone]

## 2022-11-07 ENCOUNTER — Ambulatory Visit: Payer: Medicare Other | Admitting: Urology

## 2023-05-04 ENCOUNTER — Other Ambulatory Visit: Payer: Medicare Other

## 2023-05-05 ENCOUNTER — Encounter: Payer: Self-pay | Admitting: Urology

## 2023-10-30 ENCOUNTER — Ambulatory Visit: Payer: Self-pay | Admitting: Urology

## 2023-12-04 ENCOUNTER — Ambulatory Visit: Admitting: Urology

## 2023-12-21 ENCOUNTER — Other Ambulatory Visit: Payer: Self-pay

## 2023-12-21 ENCOUNTER — Other Ambulatory Visit
Admission: RE | Admit: 2023-12-21 | Discharge: 2023-12-21 | Disposition: A | Discharge status: Home / Self Care | Attending: Urology | Admitting: Urology

## 2023-12-21 DIAGNOSIS — C61 Malignant neoplasm of prostate: Secondary | ICD-10-CM

## 2023-12-21 DIAGNOSIS — R3912 Poor urinary stream: Secondary | ICD-10-CM | POA: Diagnosis present

## 2023-12-21 DIAGNOSIS — N401 Enlarged prostate with lower urinary tract symptoms: Secondary | ICD-10-CM

## 2023-12-21 LAB — PSA: Prostatic Specific Antigen: 14.22 ng/mL — ABNORMAL HIGH (ref 0.00–4.00)

## 2023-12-24 NOTE — Progress Notes (Unsigned)
 10/31/2022 10:14 AM   Christopher Earing Filyaw 03/24/1946 [id number]  Referring provider: Don Lauraine Collar, NP [address] [place],  [place] [address]  Urological history: 1. Prostate cancer - PSA (11/2023) 14.22 - bx (2014) iPSA 6.86, 58 gram prostate, Gleason 3+3 in 2/12 cores.  2% core of lateral mid and 8% of lateral apex  - bx (2018) negative - prostate MRI (2019) PI-RADS 1 - prostate MRI (2023) PI-RADS 2   2. BPH with LU TS - prostate volume (2023) 177.28 cc  - tamsulosin 0.4 mg daily  3. Right hydrocele - right hydrocelectomy (2021)  Chief Complaint  Patient presents with   Follow-up   HPI: 78 year old man with a personal history prostate cancer on active surveillance who returns today for a six month follow-up.  Previous records reviewed.     Hx of sleep apnea.  Compliant with CPAP use.    PSA has decreased since last year.   Remains stable.   He denies any bone pain, weight loss or constitutional symptoms.      He is doing well overall. He reports having a weak stream in the morning. He feels he is emptying his bladder. He drinks a lot of water and voids frequently because of that.   Patient denies any modifying or aggravating factors.  Patient denies any recent UTI's, gross hematuria, dysuria or suprapubic/flank pain.  Patient denies any fevers, chills, nausea or vomiting.    He is taking the tamsulosin 0.4 milligrams daily.  He has issues with erectile dysfunction.  He states that he have tried PDE 5 inhibitors in the past with suboptimal results and also with ICI with suboptimal results.  He states the cost and lack of spontaneity with his treatment options make it not suitable for him.  He also has a slight ventral curvature, but it is not painful and does not interfere with intercourse.  PSA (11/2023) 14.22  Serum creatinine (09/2023) 2.27, eGFR 29  UA (09/2023) negative for micro heme  Hbg A1c (02/2023)   PVR 115 mL   PMH: Past Medical History:   Diagnosis Date   Anemia    Cancer (HCC)    Cataract    COPD (chronic obstructive pulmonary disease) (HCC)    DJD (degenerative joint disease)    shoulder   Elevated PSA    Erectile dysfunction    Hypertension    controlled on meds   Neutropenia (HCC)    Obesity    Obesity    Pneumonia    POAG (primary open-angle glaucoma)    Prostate cancer (HCC)    Pseudophakia of both eyes    Sickle cell trait (HCC)    Sleep apnea    not using CPAP   Vitamin B 12 deficiency    Wears partial dentures    upper and lower    Surgical History: Past Surgical History:  Procedure Laterality Date   COLONOSCOPY     2016/2017   COLONOSCOPY WITH PROPOFOL  N/A 02/23/2015   Procedure: COLONOSCOPY WITH PROPOFOL ;  Surgeon: Gladis RAYMOND Mariner, MD;  Location: [facility] ENDOSCOPY;  Service: Endoscopy;  Laterality: N/A;   COLONOSCOPY WITH PROPOFOL  N/A 05/21/2021   Procedure: COLONOSCOPY WITH PROPOFOL ;  Surgeon: Maryruth Ole DASEN, MD;  Location: [facility] ENDOSCOPY;  Service: Endoscopy;  Laterality: N/A;   EYE SURGERY     GLAUCOMA SURGERY     HERNIA REPAIR Right    HYDROCELE EXCISION Right 08/15/2019   Procedure: HYDROCELECTOMY ADULT;  Surgeon: Penne Knee, MD;  Location:  [facility] ORS;  Service: Urology;  Laterality: Right;   PROSTRATE BX     SHOULDER ARTHROSCOPY WITH ROTATOR CUFF REPAIR AND OPEN BICEPS TENODESIS Right 12/02/2019   Procedure: RIGHT SHOULDER ARTHROSCOPY VS MINI OPEN ROTATOR CUFF REPAIR, SUBACROMIAL DECOMPRESSION AND BICEPS TENODESIS;  Surgeon: Tobie Priest, MD;  Location: [facility];  Service: Orthopedics;  Laterality: Right;   TRABECULECTOMY     with scarring    Home Medications:  Allergies as of 12/28/2023       Reactions   Contrast Media [iodinated Contrast Media] Rash        Medication List        Accurate as of December 28, 2023 10:14 AM. If you have any questions, ask your nurse or doctor.          STOP taking these medications    lisinopril-hydrochlorothiazide 20-25  MG tablet Commonly known as: ZESTORETIC       TAKE these medications    albuterol  108 (90 Base) MCG/ACT inhaler Commonly known as: VENTOLIN  HFA Inhale 2 puffs into the lungs every 6 (six) hours as needed for wheezing or shortness of breath.   aspirin  EC 81 MG tablet Take 81 mg by mouth daily. Swallow whole.   bimatoprost 0.03 % ophthalmic solution Commonly known as: LUMIGAN Place 1 drop into both eyes at bedtime.   brimonidine-timolol 0.2-0.5 % ophthalmic solution Commonly known as: COMBIGAN Place 1 drop into both eyes every 12 (twelve) hours.   budesonide-formoterol 160-4.5 MCG/ACT inhaler Commonly known as: SYMBICORT Inhale 2 puffs into the lungs in the morning and at bedtime.   ferrous sulfate 325 (65 FE) MG tablet Take 325 mg by mouth daily with breakfast.   losartan 50 MG tablet Commonly known as: COZAAR Take 50 mg by mouth daily.   rosuvastatin 5 MG tablet Commonly known as: CRESTOR Take 5 mg by mouth daily.   Systane Balance 0.6 % Soln Generic drug: Propylene Glycol Place 1 drop into both eyes 3 (three) times daily as needed (dry/irriated eyes.).   tamsulosin 0.4 MG Caps capsule Commonly known as: FLOMAX Take 0.4 mg by mouth daily.   Vitamin B-12 5000 MCG Tbdp Take 5,000 mcg by mouth daily.        Allergies:  Allergies  Allergen Reactions   Contrast Media [Iodinated Contrast Media] Rash    Family History: Family History  Problem Relation Age of Onset   Kidney cancer Neg Hx    Kidney disease Neg Hx    Prostate cancer Neg Hx    Bladder Cancer Neg Hx     Social History:  reports that he has quit smoking. His smoking use included cigarettes. He has never used smokeless tobacco. He reports that he does not drink alcohol and does not use drugs.   Physical Exam: BP 138/72 (BP Location: Left Arm, Patient Position: Sitting, Cuff Size: Normal)   Pulse 71   Ht 5' 11 (1.803 m)   Wt 235 lb (106.6 kg)   SpO2 95%   BMI 32.78 kg/m    Constitutional:  Well nourished. Alert and oriented, No acute distress. HEENT: [place] AT, moist mucus membranes.  Trachea midline Cardiovascular: No clubbing, cyanosis, or edema. Respiratory: Normal respiratory effort, no increased work of breathing. GU: No CVA tenderness.  No bladder fullness or masses.  Patient with uncircumcised phallus. Foreskin easily retracted  Urethral meatus is patent.  No penile discharge. No penile lesions or rashes. Scrotum without lesions, cysts, rashes and/or edema.  Testicles are located scrotally bilaterally. No  masses are appreciated in the testicles. Left and right epididymis are normal.  Right hydrocele noted.   Rectal: Patient with  normal sphincter tone. Anus and perineum without scarring or rashes. No rectal masses are appreciated. Prostate is approximately 50 +  grams, could not palpate the entire gland, no nodules are appreciated. Seminal vesicles are normal. Neurologic: Grossly intact, no focal deficits, moving all 4 extremities. Psychiatric: Normal mood and affect.   Laboratory data: See HPI and EPIC I have reviewed the labs.  See HPI.     Pertinent imaging:  12/28/23 09:38  Scan Result    Assessment & Plan:    Prostate cancer  - Low risk. He had a fairly reassuring prostate MRI 11/23 - PSA remains stable - prostate MRI (2023) negative for high grade lesions - He's remains hesitant to undergo repeat biopsy; given that his PSA has remained relatively stable, could continue to manage conservatively at this time but if his PSA should rise, will need further diagnostic workup  - educated on signs/symptoms of progression (bone pain, urinary changes) and advised to report any new symptoms - Plan to continue to monitor his PSA every 6 months to ensure that it stays stable.  2. BPH with weak stream  - Symptoms managed well. Continue Flomax.  3. ED - He is not interested in pursuing other treatment options, such as IPP, at this time  Return in  about 6 months (around 06/29/2024) for  PSA only .  And free and total PSA along with a office visit in 1 year  CLOTILDA CORNWALL, [facility] [address] [place], [place] [address] [phone]

## 2023-12-28 ENCOUNTER — Encounter: Payer: Self-pay | Admitting: Urology

## 2023-12-28 ENCOUNTER — Ambulatory Visit: Admitting: Urology

## 2023-12-28 VITALS — BP 138/72 | HR 71 | Ht 71.0 in | Wt 235.0 lb

## 2023-12-28 DIAGNOSIS — C61 Malignant neoplasm of prostate: Secondary | ICD-10-CM

## 2023-12-28 DIAGNOSIS — N529 Male erectile dysfunction, unspecified: Secondary | ICD-10-CM | POA: Diagnosis not present

## 2023-12-28 DIAGNOSIS — R3912 Poor urinary stream: Secondary | ICD-10-CM

## 2023-12-28 DIAGNOSIS — N401 Enlarged prostate with lower urinary tract symptoms: Secondary | ICD-10-CM

## 2023-12-28 LAB — BLADDER SCAN AMB NON-IMAGING

## 2023-12-28 NOTE — Addendum Note (Signed)
 Addended by: ELOUISE SANTA BROCKS on: 12/28/2023 10:18 AM   Modules accepted: Orders

## 2023-12-28 NOTE — Addendum Note (Signed)
 Addended by: ELOUISE GABA C on: 12/28/2023 10:20 AM   Modules accepted: Orders

## 2024-06-29 ENCOUNTER — Other Ambulatory Visit: Payer: Self-pay

## 2024-06-29 ENCOUNTER — Other Ambulatory Visit

## 2024-06-29 ENCOUNTER — Other Ambulatory Visit
Admission: RE | Admit: 2024-06-29 | Discharge: 2024-06-29 | Disposition: A | Discharge status: Home / Self Care | Attending: Urology | Admitting: Urology

## 2024-06-29 DIAGNOSIS — C61 Malignant neoplasm of prostate: Secondary | ICD-10-CM | POA: Insufficient documentation

## 2024-06-29 LAB — PSA: Prostatic Specific Antigen: 15.3 ng/mL — ABNORMAL HIGH (ref 0.00–4.00)

## 2024-07-03 ENCOUNTER — Ambulatory Visit: Payer: Self-pay | Admitting: Urology

## 2024-07-04 NOTE — Telephone Encounter (Signed)
 Advised patient of PSA lab results. Patient verbalized understanding of information given. Lab and F/U appointment date and time confirmed. Patient verbalized understanding of information given.  Andrea Kirks LPN

## 2024-07-04 NOTE — Telephone Encounter (Signed)
-----   Message from [facility] sent at 07/03/2024 11:24 PM EST ----- Please let Mr. Cowles know that his PSA has increased a bit to 15.30 from the 14. 22 6 months ago.  We do not need to do anything at this time, but we do need to continue to monitor.  We will see him  in July follow up.

## 2024-07-06 ENCOUNTER — Encounter: Payer: Self-pay | Admitting: Urology

## 2024-12-22 ENCOUNTER — Other Ambulatory Visit

## 2024-12-27 ENCOUNTER — Ambulatory Visit: Admitting: Urology

## 2025-01-02 ENCOUNTER — Ambulatory Visit: Admitting: Urology
